# Patient Record
Sex: Female | Born: 1935 | Race: White | Hispanic: No | Marital: Married | Smoking: Never smoker
Health system: Southern US, Community
[De-identification: ages and names within clinical notes are randomized; demographics above are authoritative.]

## PROBLEM LIST (undated history)

## (undated) DIAGNOSIS — R413 Other amnesia: Secondary | ICD-10-CM

## (undated) DIAGNOSIS — E785 Hyperlipidemia, unspecified: Secondary | ICD-10-CM

## (undated) DIAGNOSIS — I10 Essential (primary) hypertension: Secondary | ICD-10-CM

## (undated) DIAGNOSIS — E669 Obesity, unspecified: Secondary | ICD-10-CM

## (undated) DIAGNOSIS — R269 Unspecified abnormalities of gait and mobility: Secondary | ICD-10-CM

## (undated) DIAGNOSIS — G20 Parkinson's disease: Principal | ICD-10-CM

## (undated) DIAGNOSIS — E079 Disorder of thyroid, unspecified: Secondary | ICD-10-CM

## (undated) HISTORY — PX: TONSILLECTOMY: SUR1361

## (undated) HISTORY — DX: Hyperlipidemia, unspecified: E78.5

## (undated) HISTORY — DX: Obesity, unspecified: E66.9

## (undated) HISTORY — DX: Parkinson's disease: G20

## (undated) HISTORY — DX: Unspecified abnormalities of gait and mobility: R26.9

## (undated) HISTORY — DX: Essential (primary) hypertension: I10

## (undated) HISTORY — DX: Disorder of thyroid, unspecified: E07.9

## (undated) HISTORY — PX: CATARACT EXTRACTION: SUR2

## (undated) HISTORY — PX: FOOT SURGERY: SHX648

## (undated) HISTORY — DX: Other amnesia: R41.3

---

## 2015-05-06 ENCOUNTER — Ambulatory Visit (INDEPENDENT_AMBULATORY_CARE_PROVIDER_SITE_OTHER): Payer: Medicare HMO

## 2015-05-06 ENCOUNTER — Ambulatory Visit (INDEPENDENT_AMBULATORY_CARE_PROVIDER_SITE_OTHER): Payer: Medicare HMO | Admitting: Emergency Medicine

## 2015-05-06 VITALS — BP 142/90 | HR 65 | Temp 98.3°F | Resp 17 | Ht 60.0 in | Wt 165.0 lb

## 2015-05-06 DIAGNOSIS — M25552 Pain in left hip: Secondary | ICD-10-CM

## 2015-05-06 DIAGNOSIS — I10 Essential (primary) hypertension: Secondary | ICD-10-CM | POA: Diagnosis not present

## 2015-05-06 DIAGNOSIS — S32010A Wedge compression fracture of first lumbar vertebra, initial encounter for closed fracture: Secondary | ICD-10-CM

## 2015-05-06 DIAGNOSIS — E782 Mixed hyperlipidemia: Secondary | ICD-10-CM

## 2015-05-06 DIAGNOSIS — M545 Low back pain, unspecified: Secondary | ICD-10-CM

## 2015-05-06 DIAGNOSIS — M4120 Other idiopathic scoliosis, site unspecified: Secondary | ICD-10-CM

## 2015-05-06 DIAGNOSIS — M549 Dorsalgia, unspecified: Secondary | ICD-10-CM | POA: Insufficient documentation

## 2015-05-06 MED ORDER — HYDROCODONE-ACETAMINOPHEN 5-325 MG PO TABS: 1.0000 | ORAL_TABLET | ORAL | Status: DC | PRN

## 2015-05-06 NOTE — Progress Notes (Signed)
Subjective:  Patient ID: Nancy Robbins, female    DOB: 07-08-1936  Age: 79 y.o. MRN: [id number]  CC: Back Pain   HPI [facility] presents  with back pain. She has a very unclear past history of chronic back pain. She was told 2 years ago that she had a fractured back. She's been by her estimation treated and evaluated by a number of different doctors none of which have provided her with pain medication. She most recently saw a neurosurgeon who sent her for x-rays and she came here. She initially said that she was here for a "second opinion" she has chronic pain in her low back and left hip. She has no history of direct injury. No history of overuse. She has pain so severe that she can barely stand for more than 5 minutes with severe pain. She's unable get dressed without assistance. Pain is not radiating not associated with any neuro symptoms numbness tingling or weakness. She's taking no medication for pain.  History Nancy Robbins has a past medical history of Hypertension; Thyroid disease; and Hyperlipidemia.   She has past surgical history that includes Foot surgery.   Her  family history is not on file.  She   reports that she has never smoked. She does not have any smokeless tobacco history on file. She reports that she does not drink alcohol or use illicit drugs.  No outpatient prescriptions prior to visit.   No facility-administered medications prior to visit.    History   Social History  . Marital Status: Married    Spouse Name: N/A  . Number of Children: N/A  . Years of Education: N/A   Social History Main Topics  . Smoking status: Never Smoker   . Smokeless tobacco: Not on file  . Alcohol Use: No  . Drug Use: No  . Sexual Activity: Not on file   Other Topics Concern  . None   Social History Narrative  . None     Review of Systems  Constitutional: Negative for fever, chills and appetite change.  HENT: Negative for congestion, ear pain, postnasal drip, sinus  pressure and sore throat.   Eyes: Negative for pain and redness.  Respiratory: Negative for cough, shortness of breath and wheezing.   Cardiovascular: Negative for leg swelling.  Gastrointestinal: Negative for nausea, vomiting, abdominal pain, diarrhea, constipation and blood in stool.  Endocrine: Negative for polyuria.  Genitourinary: Negative for dysuria, urgency, frequency and flank pain.  Musculoskeletal: Positive for back pain. Negative for gait problem.  Skin: Negative for rash.  Neurological: Negative for weakness and headaches.  Psychiatric/Behavioral: Negative for confusion and decreased concentration. The patient is not nervous/anxious.     Objective:  BP 142/90 mmHg  Pulse 65  Temp(Src) 98.3 F (36.8 C) (Oral)  Resp 17  Ht 5' (1.524 m)  Wt 165 lb (74.844 kg)  BMI 32.22 kg/m2  SpO2 98%  Physical Exam  Constitutional: She is oriented to person, place, and time. She appears well-developed and well-nourished. No distress.  HENT:  Head: Normocephalic and atraumatic.  Right Ear: External ear normal.  Left Ear: External ear normal.  Nose: Nose normal.  Eyes: Conjunctivae and EOM are normal. Pupils are equal, round, and reactive to light. No scleral icterus.  Neck: Normal range of motion. Neck supple. No tracheal deviation present.  Cardiovascular: Normal rate, regular rhythm and normal heart sounds.   Pulmonary/Chest: Effort normal. No respiratory distress. She has no wheezes. She has no rales.  Abdominal: She exhibits  no mass. There is no tenderness. There is no rebound and no guarding.  Musculoskeletal: She exhibits no edema.  Lymphadenopathy:    She has no cervical adenopathy.  Neurological: She is alert and oriented to person, place, and time. Coordination normal.  Skin: Skin is warm and dry. No rash noted.  Psychiatric: She has a normal mood and affect. Her behavior is normal.      Assessment & Plan:   Nancy CroftMildred was seen today for back pain.  Diagnoses and all  orders for this visit:  Compression fracture of L1 lumbar vertebra, closed, initial encounter  Essential hypertension, benign  Mixed hyperlipidemia  Midline low back pain without sciatica Orders: -     DG Lumbar Spine Complete; Future  Hip pain, left Orders: -     DG HIP UNILAT WITH PELVIS 2-3 VIEWS LEFT; Future  Idiopathic scoliosis  Other orders -     HYDROcodone-acetaminophen (NORCO) 5-325 MG per tablet; Take 1-2 tablets by mouth every 4 (four) hours as needed.   I am having Nancy Robbins start on HYDROcodone-acetaminophen. I am also having her maintain her simvastatin, omeprazole, metoprolol succinate, lisinopril, levothyroxine, hydrochlorothiazide, gabapentin, and amLODipine.  Meds ordered this encounter  Medications  . simvastatin (ZOCOR) 20 MG tablet    Sig: Take 20 mg by mouth at bedtime.  Marland Kitchen. omeprazole (PRILOSEC) 40 MG capsule    Sig: Take 40 mg by mouth daily.  . metoprolol succinate (TOPROL-XL) 50 MG 24 hr tablet    Sig: Take 50 mg by mouth daily. Take with or immediately following a meal.  . lisinopril (PRINIVIL,ZESTRIL) 40 MG tablet    Sig: Take 20 mg by mouth daily.  Marland Kitchen. levothyroxine (SYNTHROID, LEVOTHROID) 75 MCG tablet    Sig: Take 75 mcg by mouth daily before breakfast.  . hydrochlorothiazide (HYDRODIURIL) 25 MG tablet    Sig: Take 25 mg by mouth daily.  Marland Kitchen. gabapentin (NEURONTIN) 100 MG capsule    Sig: Take 100 mg by mouth daily.  Marland Kitchen. amLODipine (NORVASC) 2.5 MG tablet    Sig: Take 2.5 mg by mouth daily.  Marland Kitchen. HYDROcodone-acetaminophen (NORCO) 5-325 MG per tablet    Sig: Take 1-2 tablets by mouth every 4 (four) hours as needed.    Dispense:  30 tablet    Refill:  0   She was given a copy of the disc containing her x-rays and referred back to her neurosurgeon for further evaluation. She said she female him to my suggested she take him directly to the office from here.  Appropriate red flag conditions were discussed with the patient as well as actions that should be  taken.  Patient expressed his understanding.  Follow-up: Return if symptoms worsen or fail to improve.  Carmelina DaneAnderson, Jermiah Howton S, MD   [facility] reading (PRIMARY) by  Dr. Dareen PianoAnderson.  Negative hip for acute injury.  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

## 2015-05-06 NOTE — Patient Instructions (Signed)
Back, Compression Fracture °A compression fracture happens when a force is put upon the length of your spine. Slipping and falling on your bottom are examples of such a force. When this happens, sometimes the force is great enough to compress the building blocks (vertebral bodies) of your spine. Although this causes a lot of pain, this can usually be treated at home, unless your caregiver feels hospitalization is needed for pain control. °Your backbone (spinal column) is made up of 24 main vertebral bodies in addition to the sacrum and coccyx (see illustration). These are held together by tough fibrous tissues (ligaments) and by support of your muscles. Nerve roots pass through the openings between the vertebrae. A sudden wrenching move, injury, or a fall may cause a compression fracture of one of the vertebral bodies. This may result in back pain or spread of pain into the belly (abdomen), the buttocks, and down the leg into the foot. Pain may also be created by muscle spasm alone. °Large studies have been undertaken to determine the best possible course of action to help your back following injury and also to prevent future problems. The recommendations are as follows. °FOLLOWING A COMPRESSION FRACTURE: °Do the following only if advised by your caregiver.  °· If a back brace has been suggested or provided, wear it as directed. °· Do not stop wearing the back brace unless instructed by your caregiver. °· When allowed to return to regular activities, avoid a sedentary lifestyle. Actively exercise. Sporadic weekend binges of tennis, racquetball, or waterskiing may actually aggravate or create problems, especially if you are not in condition for that activity. °· Avoid sports requiring sudden body movements until you are in condition for them. Swimming and walking are safer activities. °· Maintain good posture. °· Avoid obesity. °· If not already done, you should have a DEXA scan. Based on the results, be treated for  osteoporosis. °FOLLOWING ACUTE (SUDDEN) INJURY: °· Only take over-the-counter or prescription medicines for pain, discomfort, or fever as directed by your caregiver. °· Use bed rest for only the most extreme acute episode. Prolonged bed rest may aggravate your condition. Ice used for acute conditions is effective. Use a large plastic bag filled with ice. Wrap it in a towel. This also provides excellent pain relief. This may be continuous. Or use it for 30 minutes every 2 hours during acute phase, then as needed. Heat for 30 minutes prior to activities is helpful. °· As soon as the acute phase (the time when your back is too painful for you to do normal activities) is over, it is important to resume normal activities and work hardening programs. Back injuries can cause potentially marked changes in lifestyle. So it is important to attack these problems aggressively. °· See your caregiver for continued problems. He or she can help or refer you for appropriate exercises, physical therapy, and work hardening if needed. °· If you are given narcotic medications for your condition, for the next 24 hours do not: °¨ Drive. °¨ Operate machinery or power tools. °¨ Sign legal documents. °· Do not drink alcohol, or take sleeping pills or other medications that may interfere with treatment. °If your caregiver has given you a follow-up appointment, it is very important to keep that appointment. Not keeping the appointment could result in a chronic or permanent injury, pain, and disability. If there is any problem keeping the appointment, you must call back to this facility for assistance.  °SEEK IMMEDIATE MEDICAL CARE IF: °· You develop numbness,   tingling, weakness, or problems with the use of your arms or legs. °· You develop severe back pain not relieved with medications. °· You have changes in bowel or bladder control. °· You have increasing pain in any areas of the body. °Document Released: 10/15/2005 Document Revised:  03/01/2014 Document Reviewed: 05/19/2008 °[employer]® Patient Information ©2015 [employer]. This information is not intended to replace advice given to you by your health care provider. Make sure you discuss any questions you have with your health care provider. ° °

## 2015-06-28 ENCOUNTER — Encounter: Payer: Self-pay | Admitting: Neurology

## 2015-06-28 ENCOUNTER — Ambulatory Visit (INDEPENDENT_AMBULATORY_CARE_PROVIDER_SITE_OTHER): Payer: Medicare HMO | Admitting: Neurology

## 2015-06-28 VITALS — BP 159/75 | HR 62 | Ht 60.0 in | Wt 164.5 lb

## 2015-06-28 DIAGNOSIS — G20 Parkinson's disease: Secondary | ICD-10-CM | POA: Diagnosis not present

## 2015-06-28 DIAGNOSIS — R269 Unspecified abnormalities of gait and mobility: Secondary | ICD-10-CM

## 2015-06-28 DIAGNOSIS — R413 Other amnesia: Secondary | ICD-10-CM

## 2015-06-28 HISTORY — DX: Other amnesia: R41.3

## 2015-06-28 HISTORY — DX: Unspecified abnormalities of gait and mobility: R26.9

## 2015-06-28 HISTORY — DX: Parkinson's disease: G20

## 2015-06-28 MED ORDER — CARBIDOPA-LEVODOPA 25-100 MG PO TABS
ORAL_TABLET | ORAL | Status: DC
Start: 2015-06-28 — End: 2015-10-06

## 2015-06-28 NOTE — Progress Notes (Signed)
Reason for visit: Gait disorder  Referring physician: Dr. Rexford Maus is a 79 y.o. female  History of present illness:  Nancy Robbins is a 79 year old right-handed white female with a history of a progressive gait disorder over the last 3 years. She has had gradual worsening of her ability to ambulate, and she has had increasing difficulty getting up out of a chair. She has required assistance for standing over the last 6 months, and assistance with dressing as well. She is unable to cook. She has had some tremors of both arms, and sensation of tremor in the body. She reports some blurring of vision, and some dizziness at times. She has had changes in her speech and handwriting. The handwriting has become small and sloppy. She is sleeping well at night, no vivid dreams or confusion is noted, but there has been some decline in memory. She denies any issues controlling the bowels or the bladder. She does have some low back pain without radiation down the legs, she also reports neck discomfort and headache. She was seen by Dr. Danielle Dess for the back issues, he has noted some features of Parkinson's disease, and the patient is referred to this office for this reason.  Past Medical History  Diagnosis Date  . Hypertension   . Thyroid disease   . Hyperlipidemia   . Parkinson disease 06/28/2015  . Obese   . Abnormality of gait 06/28/2015  . Memory disorder 06/28/2015    Past Surgical History  Procedure Laterality Date  . Foot surgery Bilateral     bunion  . Cataract extraction Bilateral   . Tonsillectomy      Family History  Problem Relation Age of Onset  . Hypertension Mother   . Hypertension Father   . Emphysema Sister     Social history:  reports that she has never smoked. She does not have any smokeless tobacco history on file. She reports that she does not drink alcohol or use illicit drugs.  Medications:  Prior to Admission medications   Medication Sig Start Date End Date  Taking? Authorizing Provider  amLODipine (NORVASC) 2.5 MG tablet Take 2.5 mg by mouth daily.   Yes Historical Provider, MD  aspirin 81 MG tablet Take 81 mg by mouth daily.   Yes Historical Provider, MD  CRANBERRY EXTRACT PO Take 1 tablet by mouth daily.   Yes Historical Provider, MD  hydrochlorothiazide (HYDRODIURIL) 25 MG tablet Take 25 mg by mouth daily.   Yes Historical Provider, MD  levothyroxine (SYNTHROID, LEVOTHROID) 75 MCG tablet Take 75 mcg by mouth daily before breakfast.   Yes Historical Provider, MD  lisinopril (PRINIVIL,ZESTRIL) 40 MG tablet Take 20 mg by mouth daily.   Yes Historical Provider, MD  metoprolol succinate (TOPROL-XL) 50 MG 24 hr tablet Take 50 mg by mouth daily. Take with or immediately following a meal.   Yes Historical Provider, MD  Multiple Minerals-Vitamins (CALCIUM-MAGNESIUM-ZINC-D3) TABS Take 1 tablet by mouth daily.   Yes Historical Provider, MD  omeprazole (PRILOSEC) 40 MG capsule Take 40 mg by mouth daily.   Yes Historical Provider, MD  Probiotic Product (PHILLIPS COLON HEALTH PO) Take 1 tablet by mouth daily.   Yes Historical Provider, MD  simvastatin (ZOCOR) 20 MG tablet Take 20 mg by mouth at bedtime.   Yes Historical Provider, MD      Allergies  Allergen Reactions  . Sulfa Antibiotics     ROS:  Out of a complete 14 system review of symptoms, the  patient complains only of the following symptoms, and all other reviewed systems are negative.  Blurred vision Walking problems Memory disorder  Blood pressure 159/75, pulse 62, height 5' (1.524 m), weight 164 lb 8 oz (74.617 kg).  Physical Exam  General: The patient is alert and cooperative at the time of the examination. The patient is moderately obese.  Eyes: Pupils are equal, round, and reactive to light. Discs are flat bilaterally.  Neck: The neck is supple, no carotid bruits are noted.  Respiratory: The respiratory examination is clear.  Cardiovascular: The cardiovascular examination  reveals a regular rate and rhythm, no obvious murmurs or rubs are noted.  Skin: Extremities are without significant edema.  Neurologic Exam  Mental status: The patient is alert and oriented x 3 at the time of the examination. The Mini-Mental Status Examination done today shows a total score of 23/30. The patient is able to name 5 animals in 30 seconds.  Cranial nerves: Facial symmetry is present. There is good sensation of the face to pinprick and soft touch bilaterally. The strength of the facial muscles and the muscles to head turning and shoulder shrug are normal bilaterally. Speech is well enunciated, no aphasia or dysarthria is noted. Extraocular movements are full. Visual fields are full. The tongue is midline, and the patient has symmetric elevation of the soft palate. No obvious hearing deficits are noted. Face is masked.  Motor: The motor testing reveals 5 over 5 strength of all 4 extremities. Good symmetric motor tone is noted throughout.  Sensory: Sensory testing is intact to pinprick, soft touch, vibration sensation, and position sense on all 4 extremities. No evidence of extinction is noted.  Coordination: Cerebellar testing reveals good finger-nose-finger and heel-to-shin bilaterally. Mild intention tremors are seen with finger-nose-finger bilaterally, occasional resting tremors involving the left arm is noted.  Gait and station: The patient requires assistance when standing from a seated position, unable to stand with arms crossed. There is a slight tendency to lean backwards. Posture is slightly stooped, the patient has short shuffling steps, decreased arm swing. Tandem gait is unsteady. Romberg is negative.  Reflexes: Deep tendon reflexes are symmetric and normal bilaterally. Toes are downgoing bilaterally.   Assessment/Plan:  1. Parkinson's disease  2. Memory disturbance  3. Gait disturbance  The patient will be sent for MRI evaluation of the brain given the history of  hypertension to rule out vascular parkinsonism. The patient will be placed on Sinemet taking the 25/100 mg tablets, one half tablet 3 times daily for one month, then go to 1 full tablet 3 times daily. She will contact our office if she is not tolerating the medication. I have encouraged her to remain active, we may consider physical therapy in the future. The patient will follow-up in 3 months. The memory issues will be followed over time.  Marlan Palau MD 06/28/2015 7:39 PM  [facility] [address] [place], [place] [address]  Phone [phone] Fax [phone]

## 2015-06-28 NOTE — Patient Instructions (Addendum)
   We will put you on a medication called Sinemet, watch out for dizziness, drowsiness, confusion, and nausea. We will get MRI evaluation of the brain, I will call with the report. We will follow-up in the office in several months.   Parkinson Disease Parkinson disease is a disorder of the central nervous system, which includes the brain and spinal cord. A person with this disease slowly loses the ability to completely control body movements. Within the brain, there is a group of nerve cells (basal ganglia) that help control movement. The basal ganglia are damaged and do not work properly in a person with Parkinson disease. In addition, the basal ganglia produce and use a brain chemical called dopamine. The dopamine chemical sends messages to other parts of the body to control and coordinate body movements. Dopamine levels are low in a person with Parkinson disease. If the dopamine levels are low, then the body does not receive the correct messages it needs to move normally.  CAUSES  The exact reason why the basal ganglia get damaged is not known. Some medical researchers have thought that infection, genes, environment, and certain medicines may contribute to the cause.  SYMPTOMS   An early symptom of Parkinson disease is often an uncontrolled shaking (tremor) of the hands. The tremor will often disappear when the affected hand is consciously used.  As the disease progresses, walking, talking, getting out of a chair, and new movements become more difficult.  Muscles get stiff and movements become slower.  Balance and coordination become harder.  Depression, trouble swallowing, urinary problems, constipation, and sleep problems can occur.  Later in the disease, memory and thought processes may deteriorate. DIAGNOSIS  There are no specific tests to diagnose Parkinson disease. You may be referred to a neurologist for evaluation. Your caregiver will ask about your medical history, symptoms, and  perform a physical exam. Blood tests and imaging tests of your brain may be performed to rule out other diseases. The imaging tests may include an MRI or a CT scan. TREATMENT  The goal of treatment is to relieve symptoms. Medicines may be prescribed once the symptoms become troublesome. Medicine will not stop the progression of the disease, but medicine can make movement and balance better and help control tremors. Speech and occupational therapy may also be prescribed. Sometimes, surgical treatment of the brain can be done in young people. HOME CARE INSTRUCTIONS  Get regular exercise and rest periods during the day to help prevent exhaustion and depression.  If getting dressed becomes difficult, replace buttons and zippers with Velcro and elastic on your clothing.  Take all medicine as directed by your caregiver.  Install grab bars or railings in your home to prevent falls.  Go to speech or occupational therapy as directed.  Keep all follow-up visits as directed by your caregiver. SEEK MEDICAL CARE IF:  Your symptoms are not controlled with your medicine.  You fall.  You have trouble swallowing or choke on your food. MAKE SURE YOU:  Understand these instructions.  Will watch your condition.  Will get help right away if you are not doing well or get worse. Document Released: 10/12/2000 Document Revised: 02/09/2013 Document Reviewed: 11/14/2011 [facility] Patient Information 2015 [place], [place]. This information is not intended to replace advice given to you by your health care provider. Make sure you discuss any questions you have with your health care provider.

## 2015-07-02 ENCOUNTER — Ambulatory Visit
Admission: RE | Admit: 2015-07-02 | Discharge: 2015-07-02 | Disposition: A | Discharge status: Home / Self Care | Payer: Medicare HMO | Source: Ambulatory Visit | Attending: Neurology | Admitting: Neurology

## 2015-07-02 DIAGNOSIS — R269 Unspecified abnormalities of gait and mobility: Secondary | ICD-10-CM | POA: Diagnosis not present

## 2015-07-02 DIAGNOSIS — G20 Parkinson's disease: Secondary | ICD-10-CM | POA: Diagnosis not present

## 2015-07-04 ENCOUNTER — Telehealth: Payer: Self-pay | Admitting: Neurology

## 2015-07-04 NOTE — Telephone Encounter (Signed)
I called patient. The patient has a moderate level of small vessel disease, left greater right brain. She has a history of hypertension, she is on low-dose aspirin. The patient will continue Sinemet for Parkinson's disease.   MRI brain 07/02/15:  IMPRESSION: This MRI of the brain without contrast shows T2/flair hyperintense foci in the pons and white matter of both hemispheres consistent with age related chronic microvascular ischemic change. The extent in the hemispheres is mildly more than expected for age and there is a larger confluent focus in the left parietal lobe. There are no acute findings.

## 2015-10-06 ENCOUNTER — Ambulatory Visit (INDEPENDENT_AMBULATORY_CARE_PROVIDER_SITE_OTHER): Payer: Medicare HMO | Admitting: Neurology

## 2015-10-06 ENCOUNTER — Encounter: Payer: Self-pay | Admitting: Neurology

## 2015-10-06 VITALS — BP 133/70 | HR 55 | Ht 60.0 in | Wt 160.5 lb

## 2015-10-06 DIAGNOSIS — G20 Parkinson's disease: Secondary | ICD-10-CM

## 2015-10-06 DIAGNOSIS — R269 Unspecified abnormalities of gait and mobility: Secondary | ICD-10-CM

## 2015-10-06 MED ORDER — CARBIDOPA-LEVODOPA ER 25-100 MG PO TBCR: 1.0000 | EXTENDED_RELEASE_TABLET | Freq: Three times a day (TID) | ORAL | Status: DC

## 2015-10-06 NOTE — Patient Instructions (Addendum)
We will change to long acting Sinemet (carbidopa) taking 25/100 mg three times a day. If you are doing better with the nausea, call in 4 weeks, and we will start a medication for memory.   Fall Prevention in the Home  Falls can cause injuries and can affect people from all age groups. There are many simple things that you can do to make your home safe and to help prevent falls. WHAT CAN I DO ON THE OUTSIDE OF MY HOME?  Regularly repair the edges of walkways and driveways and fix any cracks.  Remove high doorway thresholds.  Trim any shrubbery on the main path into your home.  Use bright outdoor lighting.  Clear walkways of debris and clutter, including tools and rocks.  Regularly check that handrails are securely fastened and in good repair. Both sides of any steps should have handrails.  Install guardrails along the edges of any raised decks or porches.  Have leaves, snow, and ice cleared regularly.  Use sand or salt on walkways during winter months.  In the garage, clean up any spills right away, including grease or oil spills. WHAT CAN I DO IN THE BATHROOM?  Use night lights.  Install grab bars by the toilet and in the tub and shower. Do not use towel bars as grab bars.  Use non-skid mats or decals on the floor of the tub or shower.  If you need to sit down while you are in the shower, use a plastic, non-slip stool.Marland Kitchen.  Keep the floor dry. Immediately clean up any water that spills on the floor.  Remove soap buildup in the tub or shower on a regular basis.  Attach bath mats securely with double-sided non-slip rug tape.  Remove throw rugs and other tripping hazards from the floor. WHAT CAN I DO IN THE BEDROOM?  Use night lights.  Make sure that a bedside light is easy to reach.  Do not use oversized bedding that drapes onto the floor.  Have a firm chair that has side arms to use for getting dressed.  Remove throw rugs and other tripping hazards from the  floor. WHAT CAN I DO IN THE KITCHEN?   Clean up any spills right away.  Avoid walking on wet floors.  Place frequently used items in easy-to-reach places.  If you need to reach for something above you, use a sturdy step stool that has a grab bar.  Keep electrical cables out of the way.  Do not use floor polish or wax that makes floors slippery. If you have to use wax, make sure that it is non-skid floor wax.  Remove throw rugs and other tripping hazards from the floor. WHAT CAN I DO IN THE STAIRWAYS?  Do not leave any items on the stairs.  Make sure that there are handrails on both sides of the stairs. Fix handrails that are broken or loose. Make sure that handrails are as long as the stairways.  Check any carpeting to make sure that it is firmly attached to the stairs. Fix any carpet that is loose or worn.  Avoid having throw rugs at the top or bottom of stairways, or secure the rugs with carpet tape to prevent them from moving.  Make sure that you have a light switch at the top of the stairs and the bottom of the stairs. If you do not have them, have them installed. WHAT ARE SOME OTHER FALL PREVENTION TIPS?  Wear closed-toe shoes that fit well and support  your feet. Wear shoes that have rubber soles or low heels.  When you use a stepladder, make sure that it is completely opened and that the sides are firmly locked. Have someone hold the ladder while you are using it. Do not climb a closed stepladder.  Add color or contrast paint or tape to grab bars and handrails in your home. Place contrasting color strips on the first and last steps.  Use mobility aids as needed, such as canes, walkers, scooters, and crutches.  Turn on lights if it is dark. Replace any light bulbs that burn out.  Set up furniture so that there are clear paths. Keep the furniture in the same spot.  Fix any uneven floor surfaces.  Choose a carpet design that does not hide the edge of steps of a  stairway.  Be aware of any and all pets.  Review your medicines with your healthcare provider. Some medicines can cause dizziness or changes in blood pressure, which increase your risk of falling. Talk with your health care provider about other ways that you can decrease your risk of falls. This may include working with a physical therapist or trainer to improve your strength, balance, and endurance.   This information is not intended to replace advice given to you by your health care provider. Make sure you discuss any questions you have with your health care provider.   Document Released: 10/05/2002 Document Revised: 03/01/2015 Document Reviewed: 11/19/2014 Elsevier Interactive Patient Education [employer]

## 2015-10-06 NOTE — Progress Notes (Signed)
Reason for visit: Parkinson's disease  Nancy Robbins is an 79 y.o. female  History of present illness:  Nancy Robbins is a 79 year old right-handed white female with a history of Parkinson's disease. She has been placed on Sinemet in August 2016, she indicates that she has regained quite a bit of function on the medication. She indicates that her ability to ambulate has significant improved, she is now able to dress herself, but she requires some assistance with bathing. The patient has not had any falls, she is trying to stay active, walking a quarter of a mile daily. The patient does have low back pain when she stands up too long, however. She denies any problems with speech or swallowing, she does report issues with memory, however. She has developed some nausea and motion sickness on the Sinemet, and she has lost about 6 pounds since last seen. She returns to this office for an evaluation. The patient reports a 4 month history of occipital headaches that may come on once every 2 or 3 weeks. The episodes last about 1-2 hours, she does not take anything for the headache. She does report some neck stiffness at times.  Past Medical History  Diagnosis Date  . Hypertension   . Thyroid disease   . Hyperlipidemia   . Parkinson disease (HCC) 06/28/2015  . Obese   . Abnormality of gait 06/28/2015  . Memory disorder 06/28/2015    Past Surgical History  Procedure Laterality Date  . Foot surgery Bilateral     bunion  . Cataract extraction Bilateral   . Tonsillectomy      Family History  Problem Relation Age of Onset  . Hypertension Mother   . Hypertension Father   . Emphysema Sister     Social history:  reports that she has never smoked. She has never used smokeless tobacco. She reports that she does not drink alcohol or use illicit drugs.    Allergies  Allergen Reactions  . Sulfa Antibiotics     Medications:  Prior to Admission medications   Medication Sig Start Date End Date Taking?  Authorizing Provider  amLODipine (NORVASC) 2.5 MG tablet Take 2.5 mg by mouth daily.   Yes Historical Provider, MD  aspirin 81 MG tablet Take 81 mg by mouth daily.   Yes Historical Provider, MD  carbidopa-levodopa (SINEMET) 25-100 MG per tablet 1/2 tablet three times a day for 1 month, then take 1 tablet three times a day 06/28/15  Yes York Spaniel, MD  CRANBERRY EXTRACT PO Take 1 tablet by mouth daily.   Yes Historical Provider, MD  hydrochlorothiazide (HYDRODIURIL) 25 MG tablet Take 25 mg by mouth daily.   Yes Historical Provider, MD  levothyroxine (SYNTHROID, LEVOTHROID) 75 MCG tablet Take 75 mcg by mouth daily before breakfast.   Yes Historical Provider, MD  lisinopril (PRINIVIL,ZESTRIL) 40 MG tablet Take 20 mg by mouth daily.   Yes Historical Provider, MD  metoprolol succinate (TOPROL-XL) 50 MG 24 hr tablet Take 50 mg by mouth daily. Take with or immediately following a meal.   Yes Historical Provider, MD  Multiple Minerals-Vitamins (CALCIUM-MAGNESIUM-ZINC-D3) TABS Take 1 tablet by mouth daily.   Yes Historical Provider, MD  omeprazole (PRILOSEC) 40 MG capsule Take 40 mg by mouth daily.   Yes Historical Provider, MD  Probiotic Product (PHILLIPS COLON HEALTH PO) Take 1 tablet by mouth daily.   Yes Historical Provider, MD  simvastatin (ZOCOR) 20 MG tablet Take 20 mg by mouth at bedtime.   Yes  Historical Provider, MD    ROS:  Out of a complete 14 system review of symptoms, the patient complains only of the following symptoms, and all other reviewed systems are negative.  Decreased appetite, fatigue, decreased weight Light sensitivity, double vision, loss of vision, blurred vision Shortness of breath Drooling Cold intolerance Abdominal pain, constipation, nausea Insomnia, daytime sleepiness Frequent infections Difficulty urinating, painful urination, incontinence of the bladder Back pain, walking difficulty, neck pain Moles Bruising easily Memory loss, dizziness, headache, speech  difficulty, weakness, tremors Confusion, anxiety  Blood pressure 133/70, pulse 55, height 5' (1.524 m), weight 160 lb 8 oz (72.802 kg).  Physical Exam  General: The patient is alert and cooperative at the time of the examination. The patient is moderately obese.  Neuromuscular: Range of movement of the cervical spine lacks about 10 of full lateral rotation bilaterally.  Skin: No significant peripheral edema is noted.   Neurologic Exam  Mental status: The patient is alert and oriented x 3 at the time of the examination. The patient has apparent normal recent and remote memory, with an apparently normal attention span and concentration ability. Mini-Mental Status Examination done today shows a total score 24/30. The patient is able to name 3 animals in 30 seconds.   Cranial nerves: Facial symmetry is present. Speech is normal, no aphasia or dysarthria is noted. Extraocular movements are full. Visual fields are full. Mild masking of the face is seen.  Motor: The patient has good strength in all 4 extremities.  Sensory examination: Soft touch sensation is symmetric on the face, arms, and legs.  Coordination: The patient has good finger-nose-finger bilaterally. The patient has difficulty performing heel-to-shin bilaterally, severe apraxia with the use of the lower extremities, right greater than left is noted.  Gait and station: The patient is able to arise from a seated position with the arms crossed with some difficulty. Once up, she is able to ambulate independently, some decreased arm swing on the right is noted. The patient has fair turns. Tandem gait was not tested. Romberg is negative. No drift is seen.  Reflexes: Deep tendon reflexes are symmetric.   MRI brain 07/02/15:  IMPRESSION: This MRI of the brain without contrast shows T2/flair hyperintense foci in the pons and white matter of both hemispheres consistent with age related chronic microvascular ischemic change. The extent  in the hemispheres is mildly more than expected for age and there is a larger confluent focus in the left parietal lobe. There are no acute findings.   * MRI scan images were reviewed online. I agree with the written report.          Assessment/Plan:  1. Parkinson's disease  2. Small vessel disease by MRI  3. Gait disorder  4. Occipital headache  5. Memory disturbance  The patient is doing much better with her mobility on the Sinemet, but she has developed some nausea on the medication. She is taking the medication before she eats. She will be switched to Sinemet CR, if this is not effective in improving the nausea, she will need to take her medication one hour after she eats. We may consider addition of Aricept in the future, but I do not wish to start this now while she is still having some issues with weight loss. The patient will follow-up in 4 months.  Marlan Palau. Keith Najee Manninen MD 10/06/2015 7:24 PM  [facility] [address] [place], [place] [id number]  Phone [phone] Fax [phone]

## 2015-11-18 ENCOUNTER — Telehealth: Payer: Self-pay | Admitting: Neurology

## 2015-11-18 MED ORDER — DONEPEZIL HCL 5 MG PO TABS: 5.0000 mg | ORAL_TABLET | Freq: Every day | ORAL | Status: DC

## 2015-11-18 NOTE — Telephone Encounter (Signed)
I called the patient. She is doing well with the Sinemet CR, I will call in a prescription for the Aricept, 5 mg.

## 2015-11-18 NOTE — Telephone Encounter (Signed)
Patient called, was advised by Dr. Anne Hahn to call back if she was doing okay with nausea to call back so Dr. Anne Hahn could start medication for memory.

## 2015-11-18 NOTE — Telephone Encounter (Signed)
I called the patient. She states that since switching to Sinemet CR she has not had anymore nausea. She also states that her weight has stayed consistent since she is not having nausea. She would like to know if Dr. Anne Hahn is ready to try her on memory medication.

## 2016-02-08 ENCOUNTER — Encounter: Payer: Self-pay | Admitting: Neurology

## 2016-02-08 ENCOUNTER — Ambulatory Visit (INDEPENDENT_AMBULATORY_CARE_PROVIDER_SITE_OTHER): Payer: Medicare HMO | Admitting: Neurology

## 2016-02-08 VITALS — BP 114/68 | HR 64

## 2016-02-08 DIAGNOSIS — R413 Other amnesia: Secondary | ICD-10-CM

## 2016-02-08 DIAGNOSIS — G20 Parkinson's disease: Secondary | ICD-10-CM | POA: Diagnosis not present

## 2016-02-08 DIAGNOSIS — R269 Unspecified abnormalities of gait and mobility: Secondary | ICD-10-CM

## 2016-02-08 MED ORDER — MEMANTINE HCL 28 X 5 MG & 21 X 10 MG PO TABS: ORAL_TABLET | ORAL | Status: DC

## 2016-02-08 MED ORDER — ESCITALOPRAM OXALATE 10 MG PO TABS: 10.0000 mg | ORAL_TABLET | Freq: Every day | ORAL | Status: DC

## 2016-02-08 NOTE — Patient Instructions (Signed)
   We will start Namenda for memory, the starter pack is for one month, if you do well with this, call our office for a prescription for medication to continue.

## 2016-02-08 NOTE — Progress Notes (Signed)
Reason for visit: Parkinson's disease  Nancy Robbins is an 80 y.o. female  History of present illness:  Nancy Robbins is a 80 year old right-handed white female with a history of Parkinson's disease. The patient has done relatively well with her mobility, she walks on a regular basis. She seems to be tolerating the Sinemet fairly well, but she did note some dizziness on the medication initially when she started the drug. The patient has been under stress over the last couple months associated with her family. The patient has had increased problems with dizziness that is unassociated with sitting, standing, or lying down. The patient feels dizzy all the time. She has had some occipital headaches, she indicated that when she went on Aricept the headaches worsened, and she went off of the medication. The patient does have an underlying mild memory disturbance, this has not worsened much over time. The patient is sleeping well currently. She has not had any falls, she denies issues with swallowing. She indicates that the headaches are a dull pain, on the top of the head currently. She returns for an evaluation.  Past Medical History  Diagnosis Date  . Hypertension   . Thyroid disease   . Hyperlipidemia   . Parkinson disease (HCC) 06/28/2015  . Obese   . Abnormality of gait 06/28/2015  . Memory disorder 06/28/2015    Past Surgical History  Procedure Laterality Date  . Foot surgery Bilateral     bunion  . Cataract extraction Bilateral   . Tonsillectomy      Family History  Problem Relation Age of Onset  . Hypertension Mother   . Hypertension Father   . Emphysema Sister     Social history:  reports that she has never smoked. She has never used smokeless tobacco. She reports that she does not drink alcohol or use illicit drugs.    Allergies  Allergen Reactions  . Sulfa Antibiotics     Medications:  Prior to Admission medications   Medication Sig Start Date End Date Taking?  Authorizing Provider  amLODipine (NORVASC) 2.5 MG tablet Take 2.5 mg by mouth daily.    Historical Provider, MD  aspirin 81 MG tablet Take 81 mg by mouth daily.    Historical Provider, MD  Carbidopa-Levodopa ER (SINEMET CR) 25-100 MG tablet controlled release Take 1 tablet by mouth 3 (three) times daily. 10/06/15   Nancy Spaniel, MD  Cholecalciferol (VITAMIN D3) 2000 units capsule Take by mouth.    Historical Provider, MD  CRANBERRY EXTRACT PO Take 1 tablet by mouth daily.    Historical Provider, MD  donepezil (ARICEPT) 5 MG tablet Take 1 tablet (5 mg total) by mouth at bedtime. Patient not taking: Reported on 02/08/2016 11/18/15   Nancy Spaniel, MD  hydrochlorothiazide (HYDRODIURIL) 25 MG tablet Take 25 mg by mouth daily.    Historical Provider, MD  levothyroxine (SYNTHROID, LEVOTHROID) 75 MCG tablet Take 75 mcg by mouth daily before breakfast.    Historical Provider, MD  lisinopril (PRINIVIL,ZESTRIL) 40 MG tablet Take 20 mg by mouth daily.    Historical Provider, MD  metoprolol succinate (TOPROL-XL) 50 MG 24 hr tablet Take 50 mg by mouth daily. Take with or immediately following a meal.    Historical Provider, MD  Multiple Minerals-Vitamins (CALCIUM-MAGNESIUM-ZINC-D3) TABS Take 1 tablet by mouth daily.    Historical Provider, MD  omeprazole (PRILOSEC) 40 MG capsule Take 40 mg by mouth daily.    Historical Provider, MD  oxybutynin (DITROPAN) 5 MG tablet  11/05/15   Historical Provider, MD  Probiotic Product (PHILLIPS COLON HEALTH PO) Take 1 tablet by mouth daily.    Historical Provider, MD  simvastatin (ZOCOR) 20 MG tablet Take 20 mg by mouth at bedtime.    Historical Provider, MD    ROS:  Out of a complete 14 system review of symptoms, the patient complains only of the following symptoms, and all other reviewed systems are negative.  Dizziness, headache, speech difficulty, weakness, tremors  Blood pressure 114/68, pulse 64.  Physical Exam  General: The patient is alert and cooperative  at the time of the examination.  Neuromuscular: Range of movement the cervical spine reveals a 15 loss of mobility with lateral rotation bilaterally.  Skin: No significant peripheral edema is noted.   Neurologic Exam  Mental status: The patient is alert and oriented x 3 at the time of the examination. The Mini-Mental Status Examination done today shows a total score 24/30.   Cranial nerves: Facial symmetry is present. Speech is normal, no aphasia or dysarthria is noted. Extraocular movements are full. Visual fields are full. Mild masking of the face is seen.  Motor: The patient has good strength in all 4 extremities.  Sensory examination: Soft touch sensation is symmetric on the face, arms, and legs.  Coordination: The patient has good finger-nose-finger and heel-to-shin bilaterally.  Gait and station: The patient is able to arise from a seated position with difficulty with arms crossed. Once up, the patient has good stride, good turns, slight decrease in arm swing. Tandem gait is slightly unsteady. Romberg is negative. No drift is seen.  Reflexes: Deep tendon reflexes are symmetric.   Assessment/Plan:  1. Parkinson's disease  2. Memory disturbance  3. Headache  4. Dizziness  The patient has been under a lot of stress recently with some difficulty with a sensation of dizziness. The patient has had MRI brain evaluation done recently. She reports that her headaches have improved off of Aricept, but the dizziness has started associated with recent stress. The patient will be given a low dose prescription for Lexapro, 10 mg daily. The patient will be started on Namenda for the memory issue, she will call in one month if she is tolerating the drug and we will call in a maintenance dose prescription for her. She will follow-up in 5 months.  Marlan Palau. Keith Ankit Degregorio MD 02/08/2016 7:56 PM  [facility] [address] [place], [place] [id number]  Phone  [phone] Fax [phone]

## 2016-03-07 ENCOUNTER — Telehealth: Payer: Self-pay | Admitting: Neurology

## 2016-03-07 MED ORDER — MEMANTINE HCL 10 MG PO TABS: 10.0000 mg | ORAL_TABLET | Freq: Two times a day (BID) | ORAL | Status: DC

## 2016-03-07 NOTE — Telephone Encounter (Signed)
Patient is calling and states she is having good results with memantine(NAMENDA TITRATION PAK) tablet pak and would like a Rx sent to [id number], [place]. [place].  Thanks!

## 2016-03-07 NOTE — Telephone Encounter (Signed)
Retailed as requested.  

## 2016-03-07 NOTE — Telephone Encounter (Signed)
I will send a maintenance dose of Namenda to the pharmacy.

## 2016-03-07 NOTE — Addendum Note (Signed)
Addended by: Stephanie AcreWILLIS, Adriano Bischof on: 03/07/2016 12:55 PM   Modules accepted: Orders

## 2016-03-30 ENCOUNTER — Telehealth: Payer: Self-pay | Admitting: *Deleted

## 2016-03-30 NOTE — Telephone Encounter (Signed)
[facility]: [facility]                  Taken  2-JUN-17 at 10:33AM by JPO ------------------------------------------------------------ Zackery Barefootaller [place]               CID [id number]  Patient SAME                 Pt's Dr Anne HahnWILLIS       Area Code 336 Phone# [phone] * DOB [phone]    RE RX MEMANTINE HCI/CALLING 30 DAYS ON MEDS          CHECKING IN TO SEE IF SHE CONTINUES MEDICATION       Disp:Y/N N If Y = C/B If No Response In 20minutes ============================================================

## 2016-03-30 NOTE — Telephone Encounter (Signed)
Returned pt TC. Reports that she continues to do well on memantine w/o side effects. Instructed to continue medication twice per day as ordered. [facility]. Verbalized understanding and appreciation for call. Pt says that she has a f/u appt scheduled in the fall. Agreed to call before then if needed.

## 2016-04-02 NOTE — Telephone Encounter (Signed)
Patient called to advise, [facility] still hasn't received prescription for memantine (NAMENDA) 10 MG tablet.

## 2016-04-02 NOTE — Telephone Encounter (Signed)
Spoke to pharmacy who reported that pt picked up med refill on 03/31/16. Returned call to pt and answered questions. She said that she was expecting a "pack" instead of a bottle. Explained to her that since she tolerated the titration "pack" well, the "memory pill" will now be refilled in a bottle. Reviewed instructions to take 1 tab twice a day. Agreed that she had been taking as ordered. Verbalized understanding and appreciation for call. Will call back w/ any additional questions/concerns prior to her appt scheduled in Sept.

## 2016-04-26 ENCOUNTER — Telehealth: Payer: Self-pay | Admitting: Neurology

## 2016-04-26 MED ORDER — CARBIDOPA-LEVODOPA ER 25-100 MG PO TBCR: 1.0000 | EXTENDED_RELEASE_TABLET | Freq: Three times a day (TID) | ORAL | Status: DC

## 2016-04-26 NOTE — Telephone Encounter (Signed)
Returned pt Nancy Robbins. Says that since she started taking Namenda twice a day, she has had HA, generalized pain and feels off-balance. Was previously unable to tolerate Aricept due to increased headaches. Due to hx of dementia, unsure if she has been taking her medications correctly. [facility] [place] who says that she has not picked up her Sinemet or Lexapro since April. Talked to pt again and she said that she thought that she was supposed to have another medicine but when she called the pharmacy, they said that they didn't have anything. Says that she had hallucinations on anti-depressant (Lexapro) so she stopped it. Has been unable to get around w/o help from her husband. Explained to pt that she may have worsening mobility due to her Parkinson's for which she has not been taking her medication. Refills retailed to pharmacy (as prior rx had expired). Pharmacist said that she would fill as soon as she receives new rx and pt says that she will have her husband pick up Sinemet to restart it tonight.

## 2016-04-26 NOTE — Telephone Encounter (Signed)
I called patient. The patient has had generalized pain, headaches, she is not walking well. The patient could potentially be getting headaches from [facility], she apparently has not been taking the Lexapro or the Sinemet. She believes that the Sinemet was causing some hallucinations. We will try to get a revisit, review her medications, try to improve her symptoms.

## 2016-04-26 NOTE — Telephone Encounter (Signed)
Patient reports that she feels like she is going down [place]. For the last 4-5 weeks she can hardly walk, has headaches and there is a spot that is very painful in her lower back..  She takes memantine (NAMENDA) 10 MG tablet twice a days and wonders if these problems could be coming from this medication. Please call and discuss.

## 2016-04-27 NOTE — Telephone Encounter (Signed)
Called pt back. Work-in appt scheduled for next Wed (05/02/16 @ 7:30 a.m.). Husband wrote appt on calendar. He was able to pick up Sinemet refill for pt to restart last night. Agreed to bring current meds (in bottles) to appt to assist w/ med management. Will call back w/ worsening symptoms. Voiced appreciation for call.

## 2016-05-02 ENCOUNTER — Encounter: Payer: Self-pay | Admitting: Neurology

## 2016-05-02 ENCOUNTER — Ambulatory Visit (INDEPENDENT_AMBULATORY_CARE_PROVIDER_SITE_OTHER): Payer: Medicare HMO | Admitting: Neurology

## 2016-05-02 VITALS — BP 156/93 | HR 62 | Ht 60.0 in | Wt 156.8 lb

## 2016-05-02 DIAGNOSIS — R413 Other amnesia: Secondary | ICD-10-CM | POA: Diagnosis not present

## 2016-05-02 DIAGNOSIS — R269 Unspecified abnormalities of gait and mobility: Secondary | ICD-10-CM | POA: Diagnosis not present

## 2016-05-02 DIAGNOSIS — G20 Parkinson's disease: Secondary | ICD-10-CM

## 2016-05-02 NOTE — Patient Instructions (Signed)
Fall Prevention in the Home  Falls can cause injuries and can affect people from all age groups. There are many simple things that you can do to make your home safe and to help prevent falls. WHAT CAN I DO ON THE OUTSIDE OF MY HOME?  Regularly repair the edges of walkways and driveways and fix any cracks.  Remove high doorway thresholds.  Trim any shrubbery on the main path into your home.  Use bright outdoor lighting.  Clear walkways of debris and clutter, including tools and rocks.  Regularly check that handrails are securely fastened and in good repair. Both sides of any steps should have handrails.  Install guardrails along the edges of any raised decks or porches.  Have leaves, snow, and ice cleared regularly.  Use sand or salt on walkways during winter months.  In the garage, clean up any spills right away, including grease or oil spills. WHAT CAN I DO IN THE BATHROOM?  Use night lights.  Install grab bars by the toilet and in the tub and shower. Do not use towel bars as grab bars.  Use non-skid mats or decals on the floor of the tub or shower.  If you need to sit down while you are in the shower, use a plastic, non-slip stool..  Keep the floor dry. Immediately clean up any water that spills on the floor.  Remove soap buildup in the tub or shower on a regular basis.  Attach bath mats securely with double-sided non-slip rug tape.  Remove throw rugs and other tripping hazards from the floor. WHAT CAN I DO IN THE BEDROOM?  Use night lights.  Make sure that a bedside light is easy to reach.  Do not use oversized bedding that drapes onto the floor.  Have a firm chair that has side arms to use for getting dressed.  Remove throw rugs and other tripping hazards from the floor. WHAT CAN I DO IN THE KITCHEN?   Clean up any spills right away.  Avoid walking on wet floors.  Place frequently used items in easy-to-reach places.  If you need to reach for something  above you, use a sturdy step stool that has a grab bar.  Keep electrical cables out of the way.  Do not use floor polish or wax that makes floors slippery. If you have to use wax, make sure that it is non-skid floor wax.  Remove throw rugs and other tripping hazards from the floor. WHAT CAN I DO IN THE STAIRWAYS?  Do not leave any items on the stairs.  Make sure that there are handrails on both sides of the stairs. Fix handrails that are broken or loose. Make sure that handrails are as long as the stairways.  Check any carpeting to make sure that it is firmly attached to the stairs. Fix any carpet that is loose or worn.  Avoid having throw rugs at the top or bottom of stairways, or secure the rugs with carpet tape to prevent them from moving.  Make sure that you have a light switch at the top of the stairs and the bottom of the stairs. If you do not have them, have them installed. WHAT ARE SOME OTHER FALL PREVENTION TIPS?  Wear closed-toe shoes that fit well and support your feet. Wear shoes that have rubber soles or low heels.  When you use a stepladder, make sure that it is completely opened and that the sides are firmly locked. Have someone hold the ladder while you   are using it. Do not climb a closed stepladder.  Add color or contrast paint or tape to grab bars and handrails in your home. Place contrasting color strips on the first and last steps.  Use mobility aids as needed, such as canes, walkers, scooters, and crutches.  Turn on lights if it is dark. Replace any light bulbs that burn out.  Set up furniture so that there are clear paths. Keep the furniture in the same spot.  Fix any uneven floor surfaces.  Choose a carpet design that does not hide the edge of steps of a stairway.  Be aware of any and all pets.  Review your medicines with your healthcare provider. Some medicines can cause dizziness or changes in blood pressure, which increase your risk of falling. Talk  with your health care provider about other ways that you can decrease your risk of falls. This may include working with a physical therapist or trainer to improve your strength, balance, and endurance.   This information is not intended to replace advice given to you by your health care provider. Make sure you discuss any questions you have with your health care provider.   Document Released: 10/05/2002 Document Revised: 03/01/2015 Document Reviewed: 11/19/2014 Elsevier Interactive Patient Education 2016 [employer]vier Inc.  

## 2016-05-02 NOTE — Progress Notes (Signed)
Reason for visit: Parkinson's disease  Nancy SagesMildred Robbins is an 80 y.o. female  History of present illness:  Nancy Robbins is a 80 year old right-handed white female with a history of Parkinson's disease associated with a memory disorder. The patient lives with her husband, he has been managing the medications. He indicates that he does not believe that she has been on Sinemet until just 2 weeks ago when the medication was just started. The patient is now on the Sinemet CR 25/100 mg tablet 3 times daily. The patient indicates that her walking has worsened, she has had increasing problems with getting up out of a chair and ambulating. At times, she wants to lean backwards but she has not had any falls. She does not use a cane or a walker for ambulation. She tries to walk some every day. The patient is sleeping fairly well, but she is somewhat drowsy during the day as well. She is on Namenda for memory taking 10 mg twice daily. She was given some Lexapro as she was having headache and dizzy problems associated with stress, but the medication resulted in auditory hallucinations and she stopped the drug. The patient has had improvement of the headache when she started the Sinemet. The headaches are coming up from the back of the neck into the back of the head. She denies any issues with swallowing or choking. She returns to this office for an evaluation. The memory remains a significant issue.  Past Medical History  Diagnosis Date  . Hypertension   . Thyroid disease   . Hyperlipidemia   . Parkinson disease (HCC) 06/28/2015  . Obese   . Abnormality of gait 06/28/2015  . Memory disorder 06/28/2015    Past Surgical History  Procedure Laterality Date  . Foot surgery Bilateral     bunion  . Cataract extraction Bilateral   . Tonsillectomy      Family History  Problem Relation Age of Onset  . Hypertension Mother   . Hypertension Father   . Emphysema Sister     Social history:  reports that she has  never smoked. She has never used smokeless tobacco. She reports that she does not drink alcohol or use illicit drugs.    Allergies  Allergen Reactions  . Lexapro [Escitalopram Oxalate]     Auditory hallucinations  . Sulfa Antibiotics     Medications:  Prior to Admission medications   Medication Sig Start Date End Date Taking? Authorizing Provider  amLODipine (NORVASC) 2.5 MG tablet Take 2.5 mg by mouth daily.   Yes Historical Provider, MD  aspirin 81 MG tablet Take 81 mg by mouth daily.   Yes Historical Provider, MD  Carbidopa-Levodopa ER (SINEMET CR) 25-100 MG tablet controlled release Take 1 tablet by mouth 3 (three) times daily. 04/26/16  Yes York Spanielharles K Tyiana , MD  Cholecalciferol (VITAMIN D3) 2000 units capsule Take by mouth.   Yes Historical Provider, MD  CRANBERRY EXTRACT PO Take 1 tablet by mouth daily.   Yes Historical Provider, MD  hydrochlorothiazide (HYDRODIURIL) 25 MG tablet Take 25 mg by mouth daily.   Yes Historical Provider, MD  levothyroxine (SYNTHROID, LEVOTHROID) 75 MCG tablet Take 75 mcg by mouth daily before breakfast.   Yes Historical Provider, MD  lisinopril (PRINIVIL,ZESTRIL) 40 MG tablet Take 20 mg by mouth daily.   Yes Historical Provider, MD  memantine (NAMENDA) 10 MG tablet Take 1 tablet (10 mg total) by mouth 2 (two) times daily. 03/07/16  Yes York Spanielharles K Qualyn Oyervides, MD  metoprolol  succinate (TOPROL-XL) 50 MG 24 hr tablet Take 50 mg by mouth daily. Take with or immediately following a meal.   Yes Historical Provider, MD  Multiple Minerals-Vitamins (CALCIUM-MAGNESIUM-ZINC-D3) TABS Take 1 tablet by mouth daily.   Yes Historical Provider, MD  omeprazole (PRILOSEC) 40 MG capsule Take 40 mg by mouth daily.   Yes Historical Provider, MD  oxybutynin (DITROPAN) 5 MG tablet  11/05/15  Yes Historical Provider, MD  Probiotic Product (PHILLIPS COLON HEALTH PO) Take 1 tablet by mouth daily.   Yes Historical Provider, MD  simvastatin (ZOCOR) 20 MG tablet Take 20 mg by mouth at bedtime.    Yes Historical Provider, MD    ROS:  Out of a complete 14 system review of symptoms, the patient complains only of the following symptoms, and all other reviewed systems are negative.  Activity change, fatigue Light sensitivity, blurred vision Shortness of breath Daytime sleepiness Memory loss, dizziness, headache, speech difficulty, weakness   Blood pressure 156/93, pulse 62, height 5' (1.524 m), weight 156 lb 12 oz (71.101 kg).  Physical Exam  General: The patient is alert and cooperative at the time of the examination. the patient is moderately obese.   Skin: No significant peripheral edema is noted.   Neurologic Exam  Mental status: The patient is alert and oriented x 2 at the time of the examination (not oriented to date). The Mini-Mental Status Examination done today shows a total score of 22/30. The patient is able to name 5 animals in 30 seconds..   Cranial nerves: Facial symmetry is present. Speech is normal, no aphasia or dysarthria is noted. Extraocular movements are full. Visual fields are full. Masking of the face is seen.   Motor: The patient has good strength in all 4 extremities.  Sensory examination: Soft touch sensation is symmetric on the face, arms, and legs.  Coordination: The patient has good finger-nose-finger and heel-to-shin bilaterally. The patient appears to have some apraxia with the use of the lower extremities.  Gait and station: The patient is unable to stand from a seated position with arms crossed. She is able to stand by pushing off with the arms, once up, she is able to ambulate independently, decreased arm swing, slightly stooped posture. The patient can perform tandem gait with minimal instability. Romberg is negative.   Reflexes: Deep tendon reflexes are symmetric.   Assessment/Plan:   1. Parkinson's disease   2. Gait disorder  3. Memory disorder  The patient apparently was never taking the Sinemet according to the husband. She  is now back on the medication, we will see how she does over the next 2 or 3 weeks, if no improvement in her walking is noted, we may go up on the dose of Sinemet, get her into some physical therapy. She will continue the Namenda for the memory and the memory will be followed over time. She will follow-up in September 2017.  Marlan Palau. Keith Davyn Morandi MD 05/02/2016 4:51 PM  [facility] [address] [place], [place] [id number]  Phone [phone] Fax [phone]

## 2016-05-23 ENCOUNTER — Telehealth: Payer: Self-pay | Admitting: Neurology

## 2016-05-23 NOTE — Telephone Encounter (Signed)
I called the patient. She indicates that she is doing well at this point on the Sinemet and Namenda. She will continue her current regimen, she will follow-up in September.

## 2016-05-23 NOTE — Telephone Encounter (Signed)
Pt called in having trouble with memantine (NAMENDA) 10 MG tablet and Carbidopa-Levodopa ER (SINEMET CR) 25-100 MG tablet controlled release. She says she is back on track with her medication and is feeling better. She says Dr. Anne Hahn wanted her to call in with a report.

## 2016-07-10 ENCOUNTER — Ambulatory Visit (INDEPENDENT_AMBULATORY_CARE_PROVIDER_SITE_OTHER): Payer: Medicare HMO | Admitting: Neurology

## 2016-07-10 ENCOUNTER — Encounter: Payer: Self-pay | Admitting: Neurology

## 2016-07-10 VITALS — BP 124/86 | HR 68 | Ht 60.0 in | Wt 159.8 lb

## 2016-07-10 DIAGNOSIS — R269 Unspecified abnormalities of gait and mobility: Secondary | ICD-10-CM

## 2016-07-10 DIAGNOSIS — G20 Parkinson's disease: Secondary | ICD-10-CM | POA: Diagnosis not present

## 2016-07-10 DIAGNOSIS — R413 Other amnesia: Secondary | ICD-10-CM | POA: Diagnosis not present

## 2016-07-10 NOTE — Progress Notes (Signed)
Reason for visit: Parkinson's disease, memory disturbance  Nancy SagesMildred Robbins is an 80 y.o. female  History of present illness:  Nancy Robbins is a 80 year old right-handed white female with a history of a progressive memory disturbance associated with Parkinson's disease. The patient has been placed on Sinemet taking the 25/100 mg tablets 3 times daily. Her walking has improved with this medication. She is walking 2 miles a day when the weather permits. The patient has not had any falls since last seen. She is on Namenda for the memory, she denies any significant changes in memory since last seen, she does not operate a motor vehicle. The patient is sleeping well at night, she denies any vivid dreams and she denies any issues with hallucinations during the daytime. She returns to the office today for an evaluation.  Past Medical History:  Diagnosis Date  . Abnormality of gait 06/28/2015  . Hyperlipidemia   . Hypertension   . Memory disorder 06/28/2015  . Obese   . Parkinson disease (HCC) 06/28/2015  . Thyroid disease     Past Surgical History:  Procedure Laterality Date  . CATARACT EXTRACTION Bilateral   . FOOT SURGERY Bilateral    bunion  . TONSILLECTOMY      Family History  Problem Relation Age of Onset  . Hypertension Mother   . Hypertension Father   . Emphysema Sister     Social history:  reports that she has never smoked. She has never used smokeless tobacco. She reports that she does not drink alcohol or use drugs.    Allergies  Allergen Reactions  . Lexapro [Escitalopram Oxalate]     Auditory hallucinations  . Sulfa Antibiotics     Medications:  Prior to Admission medications   Medication Sig Start Date End Date Taking? Authorizing Provider  amLODipine (NORVASC) 2.5 MG tablet Take 2.5 mg by mouth daily.   Yes Historical Provider, MD  aspirin 81 MG tablet Take 81 mg by mouth daily.   Yes Historical Provider, MD  Carbidopa-Levodopa ER (SINEMET CR) 25-100 MG tablet  controlled release Take 1 tablet by mouth 3 (three) times daily. 04/26/16  Yes York Spanielharles K Zarie Kosiba, MD  Cholecalciferol (VITAMIN D3) 2000 units capsule Take by mouth.   Yes Historical Provider, MD  CRANBERRY EXTRACT PO Take 1 tablet by mouth daily.   Yes Historical Provider, MD  hydrochlorothiazide (HYDRODIURIL) 25 MG tablet Take 25 mg by mouth daily.   Yes Historical Provider, MD  levothyroxine (SYNTHROID, LEVOTHROID) 88 MCG tablet  06/05/16  Yes Historical Provider, MD  lisinopril (PRINIVIL,ZESTRIL) 40 MG tablet Take 20 mg by mouth daily.   Yes Historical Provider, MD  memantine (NAMENDA) 10 MG tablet Take 1 tablet (10 mg total) by mouth 2 (two) times daily. 03/07/16  Yes York Spanielharles K Fawne Hughley, MD  metoprolol succinate (TOPROL-XL) 50 MG 24 hr tablet Take 50 mg by mouth daily. Take with or immediately following a meal.   Yes Historical Provider, MD  Multiple Minerals-Vitamins (CALCIUM-MAGNESIUM-ZINC-D3) TABS Take 1 tablet by mouth daily.   Yes Historical Provider, MD  omeprazole (PRILOSEC) 40 MG capsule Take 40 mg by mouth daily.   Yes Historical Provider, MD  oxybutynin (DITROPAN) 5 MG tablet  11/05/15  Yes Historical Provider, MD  Probiotic Product (PHILLIPS COLON HEALTH PO) Take 1 tablet by mouth daily.   Yes Historical Provider, MD  simvastatin (ZOCOR) 20 MG tablet Take 20 mg by mouth at bedtime.   Yes Historical Provider, MD    ROS:  Out  of a complete 14 system review of symptoms, the patient complains only of the following symptoms, and all other reviewed systems are negative.  Fatigue Drooling Light sensitivity, blurred vision Cough shortness of breath Constipation Daytime sleepiness Urgency of the bladder Back pain, achy muscles, walking difficulty, neck pain Moles Bruising easily Memory loss, dizziness, headache, speech difficulty, weakness, tremors Anxiety  Blood pressure 124/86, pulse 68, height 5' (1.524 m), weight 159 lb 12 oz (72.5 kg).  Physical Exam  General: The patient is  alert and cooperative at the time of the examination. The patient is moderately obese.  Skin: No significant peripheral edema is noted.   Neurologic Exam  Mental status: The patient is alert and oriented x 3 at the time of the examination. The Mini-Mental status examination done today shows a total score 26/30.   Cranial nerves: Facial symmetry is present. Speech is normal, no aphasia or dysarthria is noted. Extraocular movements are full. Visual fields are full.  Motor: The patient has good strength in all 4 extremities.  Sensory examination: Soft touch sensation is symmetric on the face, arms, and legs.  Coordination: The patient has good finger-nose-finger and heel-to-shin bilaterally. The patient has some apraxia with the use of the lower extremities.  Gait and station: The patient has some difficulty arising from a seated position with the arms crossed. Once up, the patient can walk independently, arm swing is seen bilaterally, decreased on the left relative to the right. Tandem gait was not attempted. Romberg is negative.  Reflexes: Deep tendon reflexes are symmetric.   Assessment/Plan:  1. Parkinson's disease  2. Memory disturbance  3. Gait disturbance  The patient will continue on her current dose of Sinemet taking the 25/100 mg tablets 3 times daily. The Namenda will be continued. A DMV request form for a handicap sticker was given. The patient will follow-up in 5 months, sooner if needed. I have encouraged the patient to remain active as she has been doing.  Marlan Palau MD 07/10/2016 12:04 PM  [facility] [address] [place], [place] [address]  Phone [phone] Fax [phone]

## 2016-12-11 ENCOUNTER — Ambulatory Visit (INDEPENDENT_AMBULATORY_CARE_PROVIDER_SITE_OTHER): Payer: Medicare HMO | Admitting: Neurology

## 2016-12-11 ENCOUNTER — Encounter: Payer: Self-pay | Admitting: Neurology

## 2016-12-11 VITALS — BP 144/75 | HR 64 | Ht 60.0 in | Wt 166.0 lb

## 2016-12-11 DIAGNOSIS — R269 Unspecified abnormalities of gait and mobility: Secondary | ICD-10-CM | POA: Diagnosis not present

## 2016-12-11 DIAGNOSIS — G20 Parkinson's disease: Secondary | ICD-10-CM

## 2016-12-11 DIAGNOSIS — R413 Other amnesia: Secondary | ICD-10-CM

## 2016-12-11 NOTE — Progress Notes (Signed)
Reason for visit: Parkinson's disease  Nancy Robbins is an 81 y.o. female  History of present illness:  Nancy Robbins is a 81 year old right-handed white female with a history of Parkinson's disease associated with a mild gait disorder and a memory disorder. The patient has been doing relatively well with her walking, she denies any falls. She is on low-dose Sinemet taking the 25/100 mg tablets 3 times daily with good tolerance. The patient is sleeping fairly well at night, but occasionally she may wake up and have some visual hallucinations in the evening hours. The patient is able to consciously realize that these are hallucinations and she does not get upset. The patient does not operate a motor vehicle, she does not do any cooking. She has not given up any activities of daily living secondary to memory. She has developed some right shoulder discomfort that occurs when she lifts the arm up. She denies any freezing episodes, she denies problems with choking with swallowing. She may drool on occasion. She comes to the office today for an evaluation.  Past Medical History:  Diagnosis Date  . Abnormality of gait 06/28/2015  . Hyperlipidemia   . Hypertension   . Memory disorder 06/28/2015  . Obese   . Parkinson disease (HCC) 06/28/2015  . Thyroid disease     Past Surgical History:  Procedure Laterality Date  . CATARACT EXTRACTION Bilateral   . FOOT SURGERY Bilateral    bunion  . TONSILLECTOMY      Family History  Problem Relation Age of Onset  . Hypertension Mother   . Hypertension Father   . Emphysema Sister     Social history:  reports that she has never smoked. She has never used smokeless tobacco. She reports that she does not drink alcohol or use drugs.    Allergies  Allergen Reactions  . Lexapro [Escitalopram Oxalate]     Auditory hallucinations  . Sulfa Antibiotics     Medications:  Prior to Admission medications   Medication Sig Start Date End Date Taking? Authorizing  Provider  amLODipine (NORVASC) 2.5 MG tablet Take 2.5 mg by mouth daily.   Yes Historical Provider, MD  aspirin 81 MG tablet Take 81 mg by mouth daily.   Yes Historical Provider, MD  Carbidopa-Levodopa ER (SINEMET CR) 25-100 MG tablet controlled release Take 1 tablet by mouth 3 (three) times daily. 04/26/16  Yes York Spaniel, MD  Cholecalciferol (VITAMIN D3) 2000 units capsule Take by mouth.   Yes Historical Provider, MD  CRANBERRY EXTRACT PO Take 1 tablet by mouth daily.   Yes Historical Provider, MD  hydrochlorothiazide (HYDRODIURIL) 25 MG tablet Take 25 mg by mouth daily.   Yes Historical Provider, MD  levothyroxine (SYNTHROID, LEVOTHROID) 88 MCG tablet  06/05/16  Yes Historical Provider, MD  lisinopril (PRINIVIL,ZESTRIL) 40 MG tablet Take 20 mg by mouth daily.   Yes Historical Provider, MD  memantine (NAMENDA) 10 MG tablet Take 1 tablet (10 mg total) by mouth 2 (two) times daily. 03/07/16  Yes York Spaniel, MD  metoprolol succinate (TOPROL-XL) 50 MG 24 hr tablet Take 50 mg by mouth daily. Take with or immediately following a meal.   Yes Historical Provider, MD  Multiple Minerals-Vitamins (CALCIUM-MAGNESIUM-ZINC-D3) TABS Take 1 tablet by mouth daily.   Yes Historical Provider, MD  omeprazole (PRILOSEC) 40 MG capsule Take 40 mg by mouth daily.   Yes Historical Provider, MD  oxybutynin (DITROPAN) 5 MG tablet  11/05/15  Yes Historical Provider, MD  Probiotic Product (  PHILLIPS COLON HEALTH PO) Take 1 tablet by mouth daily.   Yes Historical Provider, MD  simvastatin (ZOCOR) 20 MG tablet Take 20 mg by mouth at bedtime.   Yes Historical Provider, MD    ROS:  Out of a complete 14 system review of symptoms, the patient complains only of the following symptoms, and all other reviewed systems are negative.  Fatigue Drooling Light sensitivity, blurred vision Shortness of breath Constipation Daytime sleepiness Joint pain, back pain, walking difficulty, neck pain, neck stiffness Moles Memory  loss, dizziness, speech difficulty Confusion, hallucinations  Blood pressure (!) 144/75, pulse 64, height 5' (1.524 m), weight 166 lb (75.3 kg).  Physical Exam  General: The patient is alert and cooperative at the time of the examination. The patient is moderately obese.  Skin: No significant peripheral edema is noted.   Neurologic Exam  Mental status: The patient is alert and oriented x 3 at the time of the examination. The Mini-Mental Status Examination done today reveals a total score of 21/30.   Cranial nerves: Facial symmetry is present. Speech is normal, no aphasia or dysarthria is noted. Extraocular movements are full. Visual fields are full. Masking of the face is seen.  Motor: The patient has good strength in all 4 extremities.  Sensory examination: Soft touch sensation is symmetric on the face, arms, and legs.  Coordination: The patient has good finger-nose-finger and heel-to-shin bilaterally.  Gait and station: The patient has the ability to arise from a seated position with arms crossed. Once up, the patient is able to ambulate independently, there is decreased arm swing on the left relative to the right. Tandem gait is unsteady. Romberg is negative. No drift is seen.  Reflexes: Deep tendon reflexes are symmetric.   Assessment/Plan:  1. Parkinson's disease  2. Gait disorder  3. Memory disorder  The patient will continue the Namenda, and stay on the same dose of Sinemet. The patient is to remain active and walk on a regular basis. We will follow the memory issues and the mobility issues over time. The patient does have some right shoulder discomfort, this may be arthritic in nature. She may try taking Aleve with 2 tablets twice daily for the next month. If the shoulder discomfort worsens, and orthopedic referral may be required.  Marlan Palau. Keith Kirston Luty MD 12/11/2016 11:11 AM  [facility] [address] [place], [place] [address]  Phone  [phone] Fax [phone]

## 2017-04-16 ENCOUNTER — Telehealth: Payer: Self-pay | Admitting: Neurology

## 2017-04-16 MED ORDER — CARBIDOPA-LEVODOPA ER 25-100 MG PO TBCR: 1.0000 | EXTENDED_RELEASE_TABLET | Freq: Three times a day (TID) | ORAL | 11 refills | Status: DC

## 2017-04-16 MED ORDER — MEMANTINE HCL 10 MG PO TABS: 10.0000 mg | ORAL_TABLET | Freq: Two times a day (BID) | ORAL | 3 refills | Status: DC

## 2017-04-16 NOTE — Telephone Encounter (Signed)
Pt's husband called said [facility] has went out of business, her medications should be sent to [facility]

## 2017-04-16 NOTE — Telephone Encounter (Signed)
Escribed refills to [facility] as requested.  

## 2017-05-16 ENCOUNTER — Encounter: Payer: Self-pay | Admitting: Neurology

## 2017-05-16 ENCOUNTER — Ambulatory Visit (INDEPENDENT_AMBULATORY_CARE_PROVIDER_SITE_OTHER): Payer: Medicare HMO | Admitting: Neurology

## 2017-05-16 VITALS — BP 129/74 | HR 62 | Ht 60.0 in | Wt 168.0 lb

## 2017-05-16 DIAGNOSIS — R269 Unspecified abnormalities of gait and mobility: Secondary | ICD-10-CM | POA: Diagnosis not present

## 2017-05-16 DIAGNOSIS — R413 Other amnesia: Secondary | ICD-10-CM

## 2017-05-16 DIAGNOSIS — G20 Parkinson's disease: Secondary | ICD-10-CM

## 2017-05-16 MED ORDER — MEMANTINE HCL 10 MG PO TABS: 10.0000 mg | ORAL_TABLET | Freq: Two times a day (BID) | ORAL | 3 refills | Status: DC

## 2017-05-16 NOTE — Progress Notes (Signed)
Reason for visit: Parkinson's disease  Nancy Robbins is an 81 y.o. female  History of present illness:  Nancy Robbins is a 81 year old right-handed white female with a history of Parkinson's disease associated with a memory disorder. The patient has been on low-dose Sinemet taking the 25/100 mg tablet 3 times daily, she is on Namenda for her memory problems taking 10 mg twice daily. The patient has occasional hallucinations, this has not been a problem since last seen. The patient does have some difficulty getting up from a chair, she has gotten a lift chair. She is not having falls, she denies any significant issues with walking. She does have some fatigue during the day, she feels sleepy. The patient sleeps well at night, she does not act out her dreams frequently, occasionally she might verbalize in her sleep. The patient does have some blurring of vision, she is getting new glasses. She denies any significant progression of her memory since last seen. She comes to this office for an evaluation. She has had some depression since the death of her younger sister earlier this year.  Past Medical History:  Diagnosis Date  . Abnormality of gait 06/28/2015  . Hyperlipidemia   . Hypertension   . Memory disorder 06/28/2015  . Obese   . Parkinson disease (HCC) 06/28/2015  . Thyroid disease     Past Surgical History:  Procedure Laterality Date  . CATARACT EXTRACTION Bilateral   . FOOT SURGERY Bilateral    bunion  . TONSILLECTOMY      Family History  Problem Relation Age of Onset  . Hypertension Mother   . Hypertension Father   . Emphysema Sister     Social history:  reports that she has never smoked. She has never used smokeless tobacco. She reports that she does not drink alcohol or use drugs.    Allergies  Allergen Reactions  . Lexapro [Escitalopram Oxalate]     Auditory hallucinations  . Sulfa Antibiotics     Medications:  Prior to Admission medications   Medication Sig Start  Date End Date Taking? Authorizing Provider  amLODipine (NORVASC) 2.5 MG tablet Take 2.5 mg by mouth daily.   Yes [provider]  aspirin 81 MG tablet Take 81 mg by mouth daily.   Yes [provider]  Carbidopa-Levodopa ER (SINEMET CR) 25-100 MG tablet controlled release Take 1 tablet by mouth 3 (three) times daily. 04/16/17  Yes York SpanielWillis, Charles K, MD  Cholecalciferol (VITAMIN D3) 2000 units capsule Take by mouth.   Yes [provider]  CRANBERRY EXTRACT PO Take 1 tablet by mouth daily.   Yes [provider]  hydrochlorothiazide (HYDRODIURIL) 25 MG tablet Take 25 mg by mouth daily.   Yes [provider]  levothyroxine (SYNTHROID, LEVOTHROID) 88 MCG tablet  06/05/16  Yes [provider]  lisinopril (PRINIVIL,ZESTRIL) 40 MG tablet Take 20 mg by mouth daily.   Yes [provider]  memantine (NAMENDA) 10 MG tablet Take 1 tablet (10 mg total) by mouth 2 (two) times daily. 04/16/17  Yes York SpanielWillis, Charles K, MD  metoprolol succinate (TOPROL-XL) 50 MG 24 hr tablet Take 50 mg by mouth daily. Take with or immediately following a meal.   Yes [provider]  Multiple Minerals-Vitamins (CALCIUM-MAGNESIUM-ZINC-D3) TABS Take 1 tablet by mouth daily.   Yes [provider]  omeprazole (PRILOSEC) 40 MG capsule Take 40 mg by mouth daily.   Yes [provider]  oxybutynin (DITROPAN) 5 MG tablet  11/05/15  Yes [provider]  Probiotic Product (PHILLIPS COLON HEALTH PO) Take 1 tablet by mouth daily.   Yes [provider]  simvastatin (ZOCOR) 20 MG tablet Take 20 mg by mouth at bedtime.   Yes [provider]    ROS:  Out of a complete 14 system review of symptoms, the patient complains only of the following symptoms, and all other reviewed systems are negative.  Chills, excessive sweating Eye itching, blurred vision Cough, shortness of breath, choking, chest tightness leg swelling, heart murmur Swollen  abdomen, abdominal pain, incontinence of the bowels Daytime sleepiness Environmental allergies Difficulty urinating Joint pain, back pain, muscle cramps, walking difficulty, neck pain Dizziness, headache, speech difficulty, weakness   Blood pressure 129/74, pulse 62, height 5' (1.524 m), weight 168 lb (76.2 kg).  Physical Exam  General: The patient is alert and cooperative at the time of the examination.The patient is moderately to markedly obese.  Skin: No significant peripheral edema is noted.   Neurologic Exam  Mental status: The patient is alert and oriented x 2 at the time of the examination (not oriented to date). The Mini-Mental Status Examination done today shows a total score 21/30..   Cranial nerves: Facial symmetry is present. Speech is normal, no aphasia or dysarthria is noted. Extraocular movements are full. Visual fields are full.Mild masking of the face is seen.  Motor: The patient has good strength in all 4 extremities.  Sensory examination: Soft touch sensation is symmetric on the face, arms, and legs.  Coordination: The patient has good finger-nose-finger and heel-to-shin bilaterally.The patient has severe apraxia with the use of the left upper extremity.  Gait and station: The patient is able to arise from a seated position with arms crossed with some difficulty. Once up, the patient is able to ambulate without assistance, she has good stride and good turns. Tandem gait is slightly unsteady.  Romberg is negative. No drift is seen.  Reflexes: Deep tendon reflexes are symmetric.   Assessment/Plan:  1. Parkinson's disease   2. Gait disturbance   3. Memory disturbance   The patient is maintaining her mobility fairly well, she is on low-dose Sinemet we may be a will increase the dose in the future if needed. The patient was given a prescription for Namenda taking 10 mg twice daily, the memory issues will be followed over time. The patient will follow-up in  about 5 months.   Marlan Palau MD 05/16/2017 10:04 AM  [facility] [address] [place], [place] [address]  Phone [phone] Fax [phone]

## 2017-05-24 IMAGING — CR DG HIP (WITH OR WITHOUT PELVIS) 2-3V*L*
3 series · 3 of 3 positions shown · non-contrast
Comparison: None.

CLINICAL DATA: Left hip pain of unknown etiology common no known
injury

EXAM:
DG HIP (WITH OR WITHOUT PELVIS) 2-3V LEFT

[AP (1 of 2)]
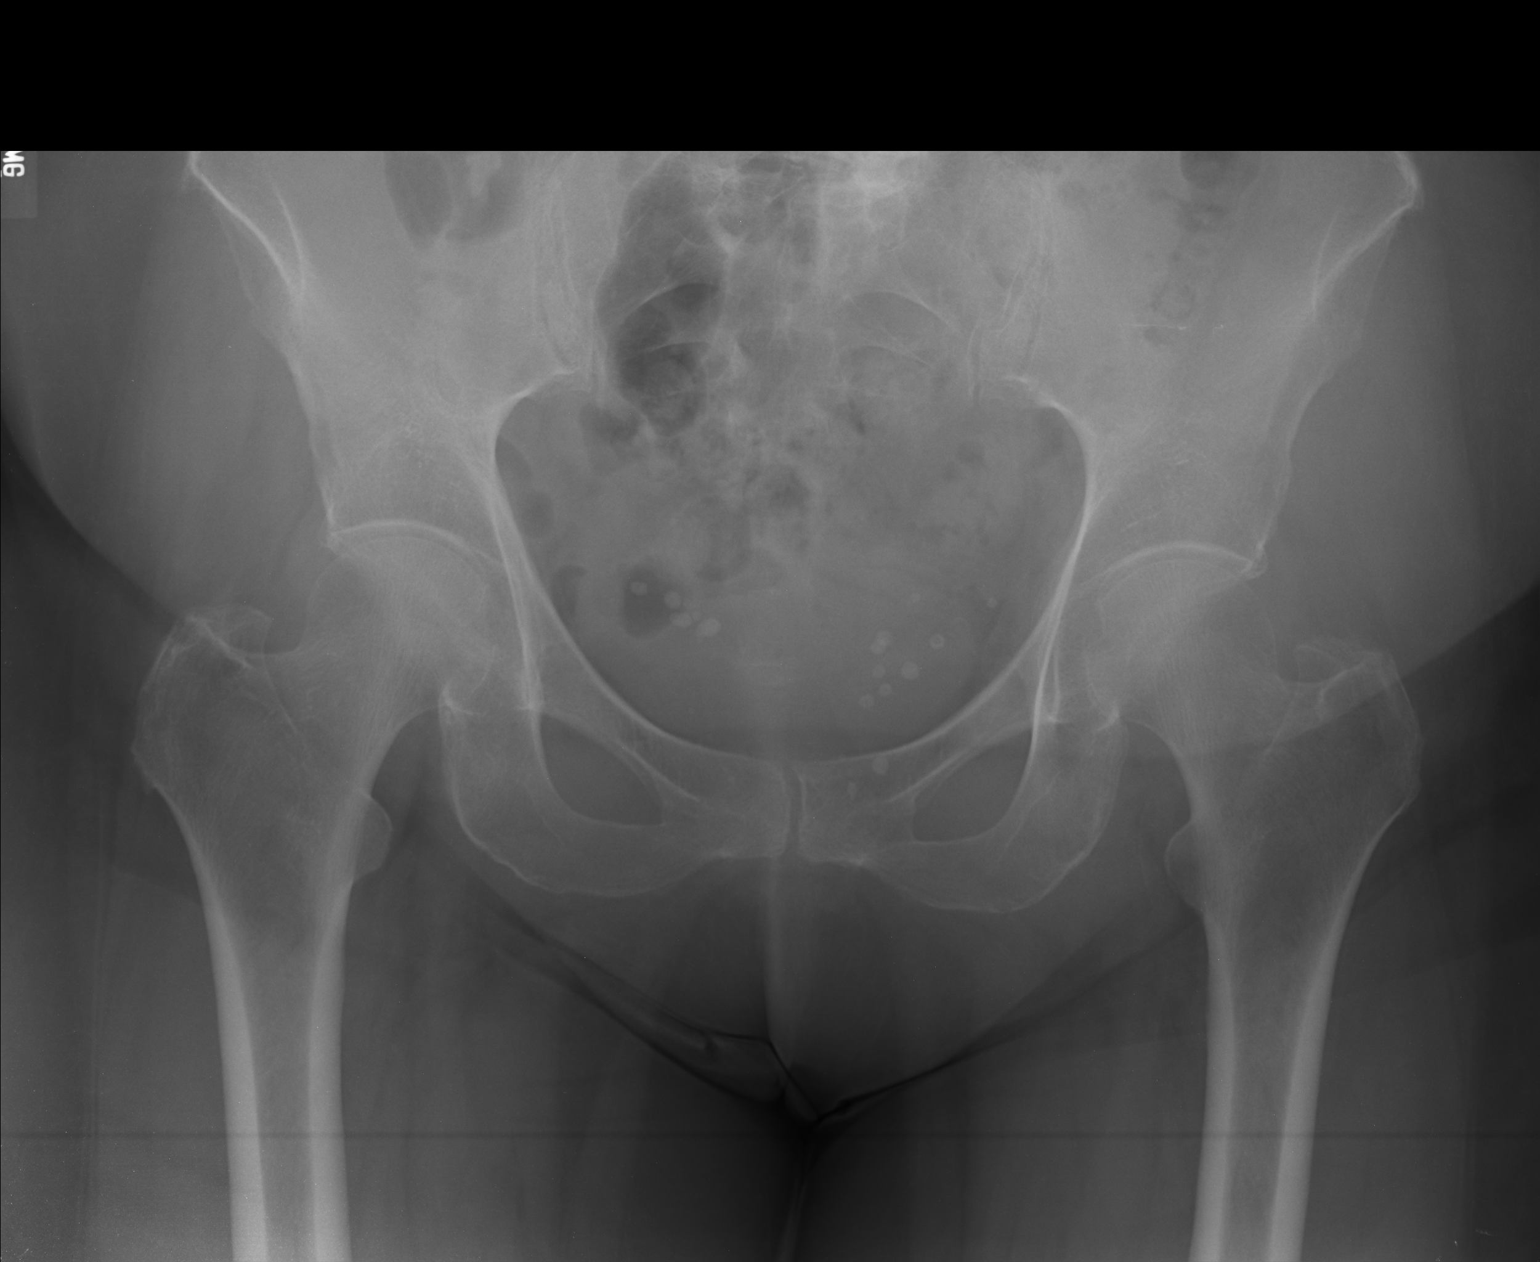

[AP (2 of 2)]
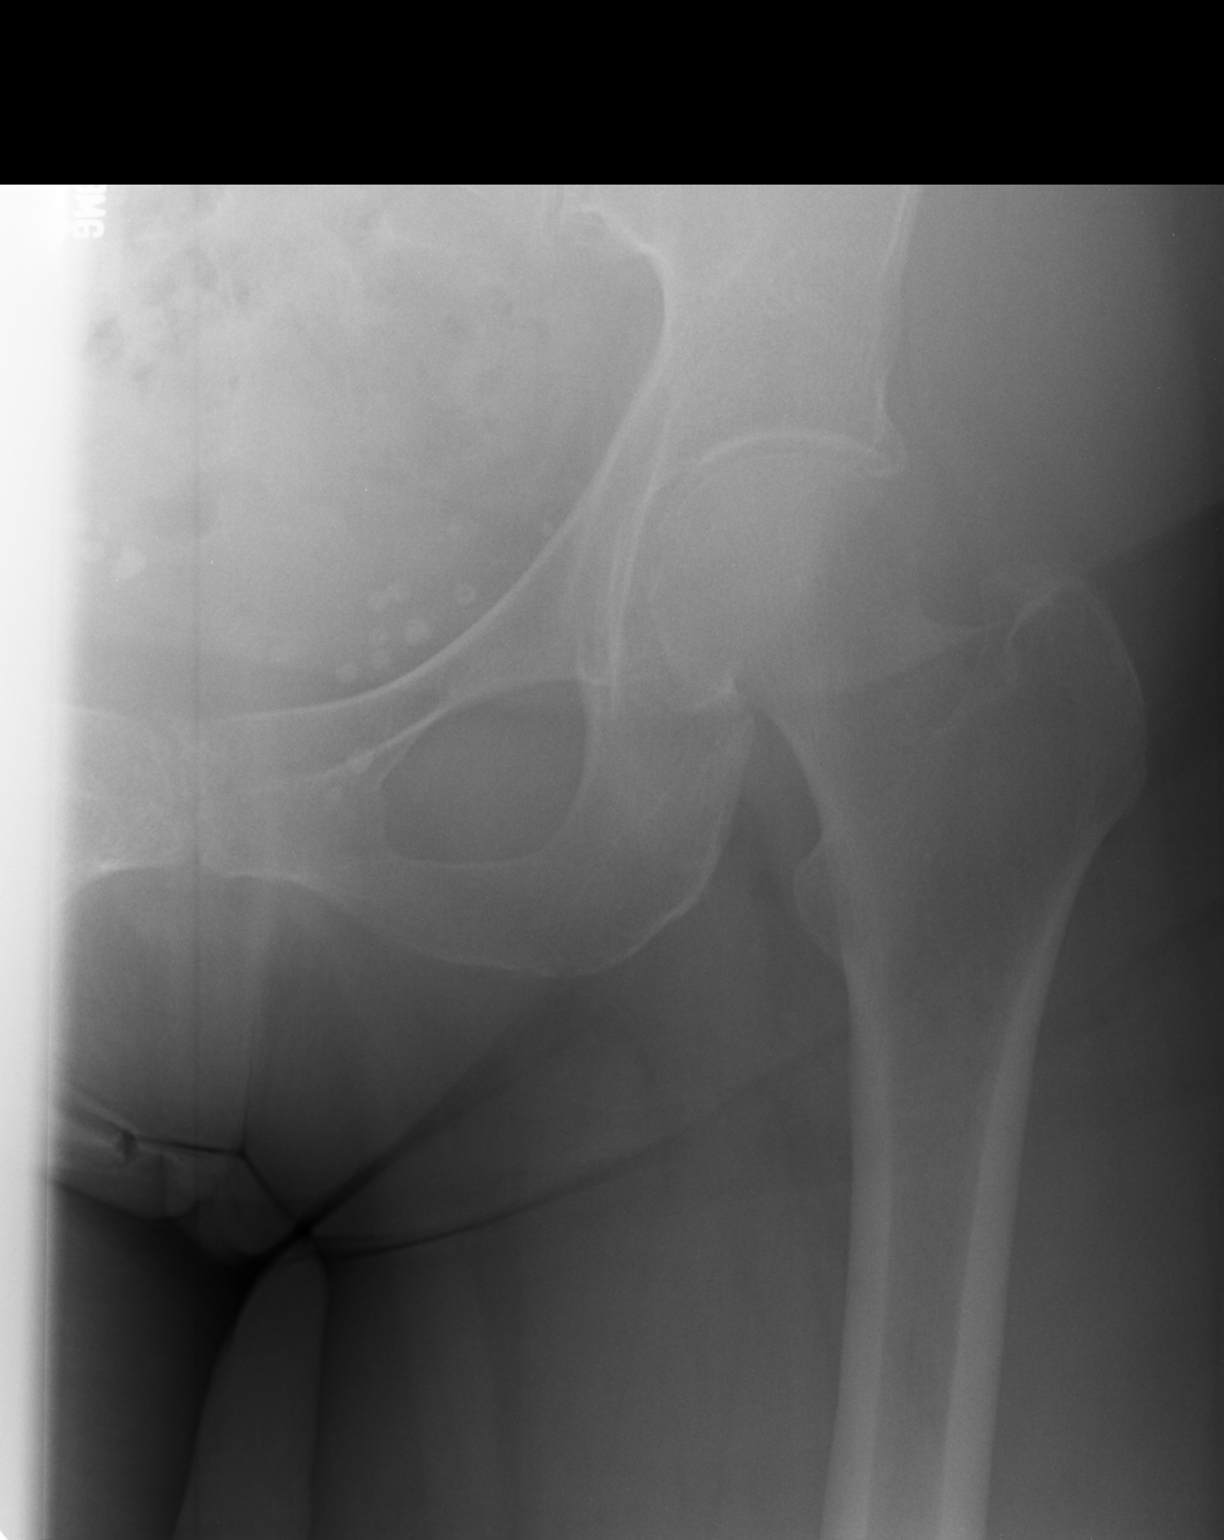

[lateral]
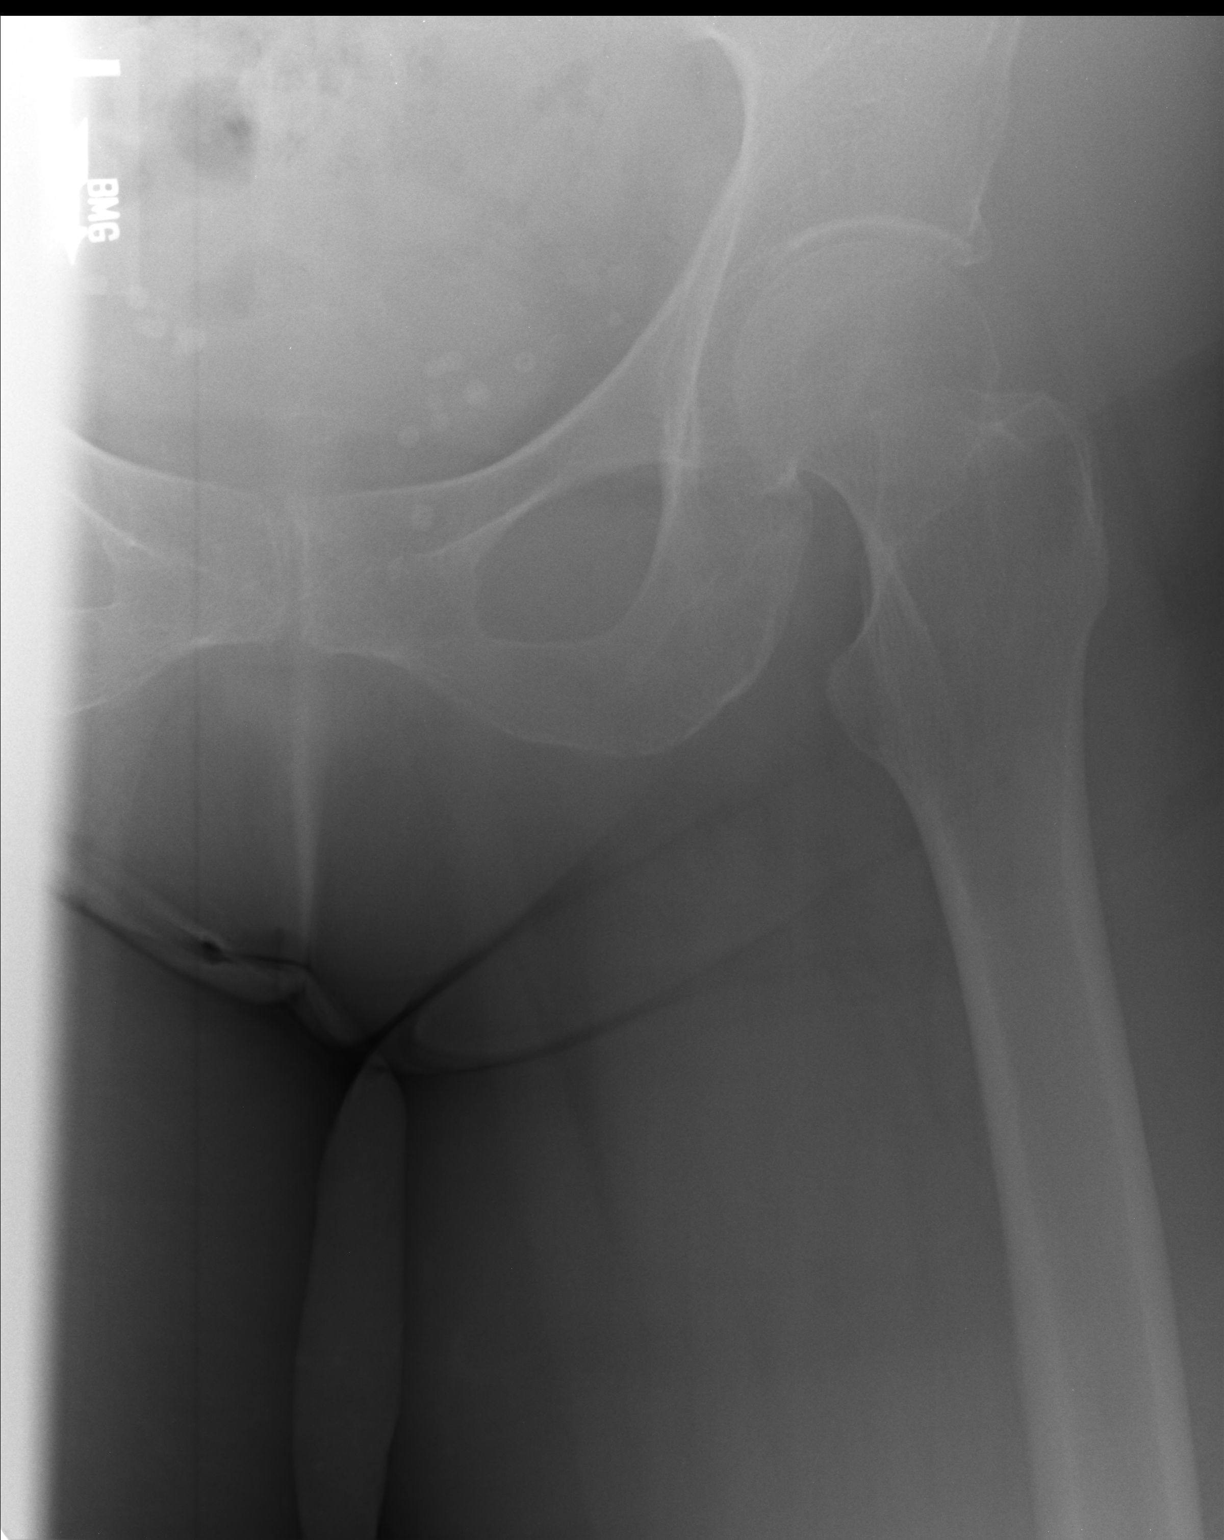

[3 of 3 positions shown; findings below may reference images not displayed]

FINDINGS: The bones are diffusely osteopenic. The observed portions of the
sacrum are unremarkable. There degenerative changes of the L4-5 and
L5-S1 facets. There is mild symmetric narrowing of the hip joint
spaces bilaterally greater on the right than on the left. AP and
frog-leg lateral views of the left hip reveals persistent mild
symmetric narrowing. The femoral head, neck, and intertrochanteric
regions are normal. The soft tissues the pelvis reveal numerous
phleboliths.
IMPRESSION: 1. There is mild degenerative change of both hips greater on the
right than on the left. There is no acute bony abnormality of the
pelvis or left hip.
2. There are mild degenerative facet joint changes of the lower
lumbar spine.

## 2017-06-03 ENCOUNTER — Telehealth: Payer: Self-pay | Admitting: Neurology

## 2017-06-03 MED ORDER — CARBIDOPA-LEVODOPA ER 25-100 MG PO TBCR: 1.0000 | EXTENDED_RELEASE_TABLET | Freq: Three times a day (TID) | ORAL | 11 refills | Status: DC

## 2017-06-03 NOTE — Telephone Encounter (Signed)
E-scribed refills as requested to pharmacy.

## 2017-06-03 NOTE — Telephone Encounter (Signed)
Patients husband called and requested a refill on rx Carbidopa, he would like it to be sent to the [facility] [place] as soon as possible. She only has one day of medication left. Please call and advise.

## 2017-06-04 ENCOUNTER — Other Ambulatory Visit: Payer: Self-pay | Admitting: Neurology

## 2017-06-05 ENCOUNTER — Telehealth: Payer: Self-pay | Admitting: Neurology

## 2017-06-05 NOTE — Telephone Encounter (Signed)
Took call from phone staff. Spoke with husband. He stated pharmacy never received refill send on 06/03/17. Advised I will call pharmacy.   Called [facility] and spoke with [employer]co Wholes[employer]Cathleen (pharmacist). Gave VO for carbidopa-levodopa ER 25-100mg  tablet (1 tablet po 3x/day). Qty 270, 3 refills, if plan allows 90 day supply. If not, she will do qty 90, 11 refills.  Called husband back. Advised we called in verbal order for them. They are getting rx ready for them now. He verbalized understanding.

## 2017-06-05 NOTE — Telephone Encounter (Signed)
Called and spoke with patient. [facility]. Advised we sent refills on 06/03/17 qty 90, 11 refills. They should have refill to pick up. She will call back to let me know if they are able to get rx or not.

## 2017-06-05 NOTE — Telephone Encounter (Signed)
Pt husband called from [facility] on the status of the Carbidopa-Levodopa ER (SINEMET CR) 25-100 MG tablet controlled release, pt wanting a call back to know if it was sent to [facility] , pt husband would [facility]  [phone] [place] [place] - [address] [facility] [address] [phone] (Phone) [phone] (Fax)   Please call

## 2017-12-02 ENCOUNTER — Encounter: Payer: Self-pay | Admitting: Neurology

## 2017-12-02 ENCOUNTER — Encounter (INDEPENDENT_AMBULATORY_CARE_PROVIDER_SITE_OTHER): Payer: Self-pay

## 2017-12-02 ENCOUNTER — Ambulatory Visit: Payer: Medicare HMO | Admitting: Neurology

## 2017-12-02 VITALS — BP 176/94 | HR 67 | Ht 60.0 in | Wt 170.5 lb

## 2017-12-02 DIAGNOSIS — R413 Other amnesia: Secondary | ICD-10-CM | POA: Diagnosis not present

## 2017-12-02 DIAGNOSIS — G20 Parkinson's disease: Secondary | ICD-10-CM | POA: Diagnosis not present

## 2017-12-02 DIAGNOSIS — R269 Unspecified abnormalities of gait and mobility: Secondary | ICD-10-CM

## 2017-12-02 MED ORDER — CARBIDOPA-LEVODOPA ER 25-100 MG PO TBCR: EXTENDED_RELEASE_TABLET | ORAL | 3 refills | Status: DC

## 2017-12-02 NOTE — Progress Notes (Signed)
Reason for visit: Parkinson's disease  Nancy Robbins is an 82 y.o. female  History of present illness:  Nancy Robbins is a 82 year old right-handed white female with a history of Parkinson's disease and a memory disorder.  The patient is on Namenda, previously she did not tolerate Aricept as this worsened her headaches.  The patient does have occipital headaches.  The patient has continued to progress with her ability to ambulate and with her memory.  She has not had any falls, but she is having increasing difficulty getting up out of a chair, she does have a lift chair at home.  She requires some assistance with bathing and dressing.  At nighttime, she may wake up with vivid dreams and the family is concerned that she may be hallucinating.  She has excessive daytime drowsiness.  She does not snore at night.  The patient is getting B12 injections as well.  She denies any significant issues with swallowing or choking.  She returns this office for an evaluation.  Currently she is on Namenda and she takes Sinemet CR 25/100 mg tablets 3 times daily.  Past Medical History:  Diagnosis Date  . Abnormality of gait 06/28/2015  . Hyperlipidemia   . Hypertension   . Memory disorder 06/28/2015  . Obese   . Parkinson disease (HCC) 06/28/2015  . Thyroid disease     Past Surgical History:  Procedure Laterality Date  . CATARACT EXTRACTION Bilateral   . FOOT SURGERY Bilateral    bunion  . TONSILLECTOMY      Family History  Problem Relation Age of Onset  . Hypertension Mother   . Hypertension Father   . Emphysema Sister     Social history:  reports that  has never smoked. she has never used smokeless tobacco. She reports that she does not drink alcohol or use drugs.    Allergies  Allergen Reactions  . Lexapro [Escitalopram Oxalate]     Auditory hallucinations  . Sulfa Antibiotics     Medications:  Prior to Admission medications   Medication Sig Start Date End Date Taking? Authorizing  Provider  amLODipine (NORVASC) 2.5 MG tablet Take 2.5 mg by mouth daily.   Yes [provider]  aspirin 81 MG tablet Take 81 mg by mouth daily.   Yes [provider]  Carbidopa-Levodopa ER (SINEMET CR) 25-100 MG tablet controlled release Two tablets in the morning and at midday, one tablet in the evening 12/02/17  Yes York Spaniel, MD  Cholecalciferol (VITAMIN D3) 2000 units capsule Take by mouth.   Yes [provider]  CRANBERRY EXTRACT PO Take 1 tablet by mouth daily.   Yes [provider]  hydrochlorothiazide (HYDRODIURIL) 25 MG tablet Take 25 mg by mouth daily.   Yes [provider]  levothyroxine (SYNTHROID, LEVOTHROID) 50 MCG tablet Take 50 mcg by mouth daily before breakfast.   Yes [provider]  lisinopril (PRINIVIL,ZESTRIL) 40 MG tablet Take 20 mg by mouth daily.   Yes [provider]  memantine (NAMENDA) 10 MG tablet Take 1 tablet (10 mg total) by mouth 2 (two) times daily. 05/16/17  Yes York Spaniel, MD  metoprolol succinate (TOPROL-XL) 25 MG 24 hr tablet Take 25 mg by mouth daily.   Yes [provider]  Multiple Minerals-Vitamins (CALCIUM-MAGNESIUM-ZINC-D3) TABS Take 1 tablet by mouth daily.   Yes [provider]  omeprazole (PRILOSEC) 40 MG capsule Take 40 mg by mouth daily.   Yes [provider]  oxybutynin (DITROPAN)  5 MG tablet  11/05/15  Yes [provider]  Probiotic Product (PHILLIPS COLON HEALTH PO) Take 1 tablet by mouth daily.   Yes [provider]  simvastatin (ZOCOR) 20 MG tablet Take 20 mg by mouth at bedtime.   Yes [provider]    ROS:  Out of a complete 14 system review of symptoms, the patient complains only of the following symptoms, and all other reviewed systems are negative.  Fatigue Difficulty swallowing, drooling Eye discharge, light sensitivity, blurred vision Shortness of breath Frequent waking, daytime sleepiness Muscle cramps,  walking difficulty, neck stiffness Memory loss, dizziness, headache, speech difficulty, weakness, tremors Hallucinations  Blood pressure (!) 176/94, pulse 67, height 5' (1.524 m), weight 170 lb 8 oz (77.3 kg).  Physical Exam  General: The patient is alert and cooperative at the time of the examination.  The patient is moderately to markedly obese.  Skin: No significant peripheral edema is noted.   Neurologic Exam  Mental status: The patient is alert and oriented x 3 at the time of the examination. The Mini-Mental status examination done today shows a total score 21/30.   Cranial nerves: Facial symmetry is present. Speech is normal, no aphasia or dysarthria is noted. Extraocular movements are full. Visual fields are full.  Masking of the face is seen.  Motor: The patient has good strength in all 4 extremities.  Sensory examination: Soft touch sensation is symmetric on the face, arms, and legs.  Coordination: The patient has good finger-nose-finger and heel-to-shin bilaterally.  Apraxia with the use of the lower extremities is noted.  Gait and station: The patient is unable to arise from a seated position with arms crossed.  Once up, she can walk independently, decreased arm swing is seen.  Posture is slightly stooped.  Tandem gait was not attempted.  Romberg is negative.  Reflexes: Deep tendon reflexes are symmetric.   Assessment/Plan:  1.  Parkinson's disease  2.  Gait disorder  3.  Memory disorder  The patient will continue the Namenda.  She will be increased on the Sinemet taking the 25/100 mg CR tablets, 2 in the morning and 2 at midday, 1 in the evening.  I have discussed the possibility of getting a sleep study, the patient does not wish to pursue this study at this time.  A prescription for Sinemet was sent in.  The patient will follow-up in 5 months.  Marlan Palau. Keith Carlee Vonderhaar MD 12/02/2017 10:38 AM  [facility] [address] [place], [place]  [id number]  Phone [phone] Fax [phone]

## 2017-12-02 NOTE — Patient Instructions (Signed)
   We will go up on the Sinemet (carbidopa) to 2 tablets in the morning and noon, one in the evening.  Sinemet (carbidopa) may result in confusion or hallucinations, drowsiness, nausea, or dizziness. If any significant side effects are noted, please contact our office. Sinemet may not be well absorbed when taken with high protein meals, if tolerated it is best to take 30-45 minutes before you eat.

## 2018-05-30 ENCOUNTER — Other Ambulatory Visit: Payer: Self-pay | Admitting: Neurology

## 2018-06-06 ENCOUNTER — Encounter: Payer: Self-pay | Admitting: Neurology

## 2018-06-06 ENCOUNTER — Ambulatory Visit: Payer: Medicare HMO | Admitting: Neurology

## 2018-06-06 VITALS — BP 119/70 | HR 65 | Wt 166.5 lb

## 2018-06-06 DIAGNOSIS — R269 Unspecified abnormalities of gait and mobility: Secondary | ICD-10-CM | POA: Diagnosis not present

## 2018-06-06 DIAGNOSIS — R413 Other amnesia: Secondary | ICD-10-CM

## 2018-06-06 DIAGNOSIS — G20A1 Parkinson's disease without dyskinesia, without mention of fluctuations: Secondary | ICD-10-CM

## 2018-06-06 DIAGNOSIS — G20 Parkinson's disease: Secondary | ICD-10-CM

## 2018-06-06 MED ORDER — LORAZEPAM 0.5 MG PO TABS: 0.5000 mg | ORAL_TABLET | Freq: Every day | ORAL | 3 refills | Status: AC

## 2018-06-06 MED ORDER — CARBIDOPA-LEVODOPA ER 50-200 MG PO TBCR: 1.0000 | EXTENDED_RELEASE_TABLET | Freq: Three times a day (TID) | ORAL | 3 refills | Status: AC

## 2018-06-06 NOTE — Progress Notes (Signed)
Reason for visit: Parkinson's disease  Nancy Robbins is an 82 y.o. female  History of present illness:  Nancy Robbins is a 82 year old right-handed white female with a history of Parkinson's disease associated with a gait disorder.  The patient also has a memory issue, she is on Namenda for this, she could not tolerate Aricept previously.  The patient is continued to have some decline in her cognitive abilities.  She recently has reported some difficulty with shortness of breath with minimal activity.  She has not yet seen her physician about this.  The patient is limited in how far she can walk because of shortness of breath, she can only go about 30 yards.  She has not had any falls, but she does have some gait instability.  She is sleepy throughout the day, at times she does not sleep well at night.  She is waking up at night with vivid dreams that are frightening to her at times.  Her husband indicates that she will talk and call out in her sleep.  The patient has a lift chair at home, she has a lot of difficulty getting up out of a chair.  She denies actual hallucinations during the day.  She returns to this office for an evaluation.  She denies issues with swallowing or choking.  Past Medical History:  Diagnosis Date  . Abnormality of gait 06/28/2015  . Hyperlipidemia   . Hypertension   . Memory disorder 06/28/2015  . Obese   . Parkinson disease (HCC) 06/28/2015  . Thyroid disease     Past Surgical History:  Procedure Laterality Date  . CATARACT EXTRACTION Bilateral   . FOOT SURGERY Bilateral    bunion  . TONSILLECTOMY      Family History  Problem Relation Age of Onset  . Hypertension Mother   . Hypertension Father   . Emphysema Sister     Social history:  reports that she has never smoked. She has never used smokeless tobacco. She reports that she does not drink alcohol or use drugs.    Allergies  Allergen Reactions  . Lexapro [Escitalopram Oxalate]     Auditory  hallucinations  . Sulfa Antibiotics     Medications:  Prior to Admission medications   Medication Sig Start Date End Date Taking? Authorizing Provider  aspirin 81 MG tablet Take 81 mg by mouth daily.   Yes [provider]  Cholecalciferol (VITAMIN D3) 2000 units capsule Take by mouth.   Yes [provider]  CRANBERRY EXTRACT PO Take 1 tablet by mouth daily.   Yes [provider]  hydrochlorothiazide (HYDRODIURIL) 25 MG tablet Take 25 mg by mouth daily.   Yes [provider]  levothyroxine (SYNTHROID, LEVOTHROID) 50 MCG tablet Take 50 mcg by mouth daily before breakfast.   Yes [provider]  lisinopril (PRINIVIL,ZESTRIL) 40 MG tablet Take 20 mg by mouth daily.   Yes [provider]  memantine (NAMENDA) 10 MG tablet TAKE 1 TABLET(10 MG) BY MOUTH TWICE DAILY 06/02/18  Yes York Spaniel, MD  metoprolol succinate (TOPROL-XL) 25 MG 24 hr tablet Take 25 mg by mouth daily.   Yes [provider]  Multiple Minerals-Vitamins (CALCIUM-MAGNESIUM-ZINC-D3) TABS Take 1 tablet by mouth daily.   Yes [provider]  pantoprazole (PROTONIX) 40 MG tablet Take 40 mg by mouth daily.   Yes [provider]  Probiotic Product (PHILLIPS COLON HEALTH PO) Take 1 tablet by mouth daily.   Yes [provider]  simvastatin (ZOCOR) 20 MG tablet Take 20 mg by mouth at bedtime.   Yes [provider]  carbidopa-levodopa (SINEMET CR) 50-200 MG tablet Take 1 tablet by mouth 3 (three) times daily. 06/06/18   York SpanielWillis, Charles K, MD  LORazepam (ATIVAN) 0.5 MG tablet Take 1 tablet (0.5 mg total) by mouth at bedtime. 06/06/18   York SpanielWillis, Charles K, MD    ROS:  Out of a complete 14 system review of symptoms, the patient complains only of the following symptoms, and all other reviewed systems are negative.  Chills Eye itching, blurred vision Sleep talking Joint pain, back pain, walking difficulty, neck pain, neck stiffness Dizziness,  headache, numbness, speech difficulty, weakness Confusion  Blood pressure 119/70, pulse 65, weight 166 lb 8 oz (75.5 kg), SpO2 94 %.  Physical Exam  General: The patient is alert and cooperative at the time of the examination.  The patient is moderately to markedly obese.  Skin: Slight edema the ankles is seen bilaterally.   Neurologic Exam  Mental status: The patient is alert and oriented x 2 at the time of the examination (not oriented to date). The Mini-Mental status examination done today shows a total score of 16/30.   Cranial nerves: Facial symmetry is present. Speech is normal, no aphasia or dysarthria is noted. Extraocular movements are full. Visual fields are full.  Masking the space is seen.  Motor: The patient has good strength in all 4 extremities.  Sensory examination: Soft touch sensation is symmetric on the face, arms, and legs.  Coordination: The patient has good finger-nose-finger and heel-to-shin bilaterally.  The patient does have some apraxia with use of the extremities.  Gait and station: The patient requires some assistance with standing.  Once up, the patient is able to ambulate independently, she does have some stability with turns, decreased arm swing, stooped posture.  Tandem gait was not attempted.  Romberg is negative.  Reflexes: Deep tendon reflexes are symmetric.   Assessment/Plan:  1.  Parkinson's disease  2.  Gait disorder  3.  Memory disorder  4.  REM sleep disorder  The patient will be given a trial on low-dose Ativan at night for her REM sleep issues, she is having vivid dreams at night.  The patient will be increased on Sinemet taking the 50/200 mg CR tablets, 1 tablet 3 times daily.  We will get physical therapy out to the house to help with gait stability and safety.  The patient is try to exercise as much as possible, but she is having some shortness of breath.  She is to contact her primary care physician regarding this.  She will  follow-up here in 5 months.   Marlan Palau. Keith Willis MD 06/06/2018 9:32 AM  [facility] [address] [place], [place] [address]  Phone [phone] Fax [phone]

## 2018-06-06 NOTE — Patient Instructions (Signed)
We will change over to Sinemet 50/200 mg tablets, take one three times a day.  You may use up the 25/100 mg tablets by taking 2 three times a day.  We will start Ativan 0.5 mg at night for the bad dreams.  Sinemet (carbidopa) may result in confusion or hallucinations, drowsiness, nausea, or dizziness. If any significant side effects are noted, please contact our office. Sinemet may not be well absorbed when taken with high protein meals, if tolerated it is best to take 30-45 minutes before you eat.

## 2018-06-27 ENCOUNTER — Telehealth: Payer: Self-pay | Admitting: *Deleted

## 2018-06-27 NOTE — Telephone Encounter (Signed)
Faxed signed POC back to [facility]. [id number], [id number]; [id number], [id number], [email or web address], MSW effective 06/22/18 1WK1. Fax: [phone]. Received fax confirmation.

## 2018-07-04 ENCOUNTER — Telehealth: Payer: Self-pay | Admitting: *Deleted

## 2018-07-04 NOTE — Telephone Encounter (Signed)
Faxed signed order back to  [facility] re: "MSW evaluation performed. No additional visits required". Fax: [phone]. Received fax confirmation.

## 2018-07-28 ENCOUNTER — Telehealth: Payer: Self-pay | Admitting: Neurology

## 2018-07-28 DIAGNOSIS — F0280 Dementia in other diseases classified elsewhere without behavioral disturbance: Secondary | ICD-10-CM

## 2018-07-28 DIAGNOSIS — G301 Alzheimer's disease with late onset: Principal | ICD-10-CM

## 2018-07-28 DIAGNOSIS — R269 Unspecified abnormalities of gait and mobility: Secondary | ICD-10-CM

## 2018-07-28 NOTE — Addendum Note (Signed)
Addended by: York Spaniel on: 07/28/2018 04:29 PM   Modules accepted: Orders

## 2018-07-28 NOTE — Telephone Encounter (Signed)
Patient's granddaughter [place] calling back stating patient is not eligible for long or short term care but is for home health for daily activities. Please send request to [facility] health. Please call [place] and advise.

## 2018-07-28 NOTE — Telephone Encounter (Signed)
Placed order in mail.

## 2018-07-28 NOTE — Telephone Encounter (Signed)
An order for home assistance was sent to [facility]

## 2018-07-28 NOTE — Telephone Encounter (Signed)
I called the granddaughter.  The patient is having increasing problems with dementia, she needs help with bathing, she will not let family members do this.  The granddaughter believes that they have long-term healthcare insurance, if this is the case, they are to contact me with what I need to put into a letter to help activate the policy.  They need a prescription for a shower chair, I will write this, we will mail this to them.

## 2018-07-28 NOTE — Telephone Encounter (Signed)
Pts granddaughter Jordan(on [place]) called stating the pt has became more off balance. Would like to discus getting the pt and the pts husband help around the house such as bathing, feeding and ect. Please call to advise

## 2018-08-19 NOTE — Telephone Encounter (Signed)
Patient's granddaughter [place] (on [place]) states no one from [facility] called. She wants to know if there is anyway a Hospice nurse can come evaluate the patient.

## 2018-08-20 NOTE — Telephone Encounter (Signed)
Faith, I hurd back from [place] Patient will need a follow apt. Thanks Annabelle Harman

## 2018-08-20 NOTE — Telephone Encounter (Signed)
Spoke with [place] and explained that per [facility], they need ov notes w/i 30 days in order to see pt., so pt. needs another appt. with Dr. Anne Hahn to discuss need for home health. She verbalized understanding of same, sts. she has taken her mother to the ER b/c she feels her condition has worsened. She asked about Hospice and I have explained that it is most appropriate for the pcp to initiate hospice/fim

## 2018-09-28 DEATH — deceased

## 2018-11-13 ENCOUNTER — Ambulatory Visit: Payer: Medicare HMO | Admitting: Neurology
